# Patient Record
Sex: Female | Born: 1965 | Race: White | Hispanic: No | Marital: Married | Smoking: Current every day smoker
Health system: Southern US, Community
[De-identification: ages and names within clinical notes are randomized; demographics above are authoritative.]

## PROBLEM LIST (undated history)

## (undated) DIAGNOSIS — F329 Major depressive disorder, single episode, unspecified: Secondary | ICD-10-CM

## (undated) DIAGNOSIS — J45909 Unspecified asthma, uncomplicated: Secondary | ICD-10-CM

## (undated) DIAGNOSIS — R058 Other specified cough: Secondary | ICD-10-CM

## (undated) DIAGNOSIS — R011 Cardiac murmur, unspecified: Secondary | ICD-10-CM

## (undated) DIAGNOSIS — J189 Pneumonia, unspecified organism: Secondary | ICD-10-CM

## (undated) DIAGNOSIS — F419 Anxiety disorder, unspecified: Secondary | ICD-10-CM

## (undated) DIAGNOSIS — K219 Gastro-esophageal reflux disease without esophagitis: Secondary | ICD-10-CM

## (undated) DIAGNOSIS — D649 Anemia, unspecified: Secondary | ICD-10-CM

## (undated) DIAGNOSIS — J449 Chronic obstructive pulmonary disease, unspecified: Secondary | ICD-10-CM

## (undated) DIAGNOSIS — R05 Cough: Secondary | ICD-10-CM

## (undated) DIAGNOSIS — E119 Type 2 diabetes mellitus without complications: Secondary | ICD-10-CM

## (undated) DIAGNOSIS — I509 Heart failure, unspecified: Secondary | ICD-10-CM

## (undated) DIAGNOSIS — M1990 Unspecified osteoarthritis, unspecified site: Secondary | ICD-10-CM

## (undated) DIAGNOSIS — F32A Depression, unspecified: Secondary | ICD-10-CM

## (undated) DIAGNOSIS — R0981 Nasal congestion: Secondary | ICD-10-CM

## (undated) HISTORY — PX: APPENDECTOMY: SHX54

## (undated) HISTORY — PX: BREAST SURGERY: SHX581

## (undated) HISTORY — PX: CATARACT EXTRACTION W/ INTRAOCULAR LENS  IMPLANT, BILATERAL: SHX1307

---

## 1996-06-07 HISTORY — PX: ABDOMINAL HYSTERECTOMY: SHX81

## 2000-01-22 ENCOUNTER — Inpatient Hospital Stay (HOSPITAL_COMMUNITY): Admission: AD | Admit: 2000-01-22 | Discharge: 2000-01-22 | Payer: Self-pay | Admitting: *Deleted

## 2001-08-01 ENCOUNTER — Ambulatory Visit (HOSPITAL_COMMUNITY): Admission: RE | Admit: 2001-08-01 | Discharge: 2001-08-01 | Payer: Self-pay | Admitting: *Deleted

## 2001-08-01 ENCOUNTER — Encounter: Payer: Self-pay | Admitting: *Deleted

## 2002-10-22 ENCOUNTER — Encounter: Admission: RE | Admit: 2002-10-22 | Discharge: 2002-10-22 | Payer: Self-pay | Admitting: General Surgery

## 2002-10-22 ENCOUNTER — Encounter: Payer: Self-pay | Admitting: General Surgery

## 2003-09-05 ENCOUNTER — Encounter: Admission: RE | Admit: 2003-09-05 | Discharge: 2003-09-05 | Payer: Self-pay | Admitting: *Deleted

## 2010-06-28 ENCOUNTER — Encounter: Payer: Self-pay | Admitting: Rheumatology

## 2012-03-06 DIAGNOSIS — N6019 Diffuse cystic mastopathy of unspecified breast: Secondary | ICD-10-CM

## 2012-03-06 HISTORY — DX: Diffuse cystic mastopathy of unspecified breast: N60.19

## 2012-03-21 DIAGNOSIS — N61 Mastitis without abscess: Secondary | ICD-10-CM

## 2012-03-21 HISTORY — DX: Mastitis without abscess: N61.0

## 2012-03-31 DIAGNOSIS — J45909 Unspecified asthma, uncomplicated: Secondary | ICD-10-CM | POA: Insufficient documentation

## 2012-03-31 DIAGNOSIS — M797 Fibromyalgia: Secondary | ICD-10-CM

## 2012-03-31 DIAGNOSIS — Z8614 Personal history of Methicillin resistant Staphylococcus aureus infection: Secondary | ICD-10-CM

## 2012-03-31 DIAGNOSIS — I10 Essential (primary) hypertension: Secondary | ICD-10-CM | POA: Insufficient documentation

## 2012-03-31 HISTORY — DX: Fibromyalgia: M79.7

## 2012-03-31 HISTORY — DX: Essential (primary) hypertension: I10

## 2012-03-31 HISTORY — DX: Personal history of Methicillin resistant Staphylococcus aureus infection: Z86.14

## 2013-06-07 DIAGNOSIS — J189 Pneumonia, unspecified organism: Secondary | ICD-10-CM

## 2013-06-07 HISTORY — DX: Pneumonia, unspecified organism: J18.9

## 2013-06-25 DIAGNOSIS — G629 Polyneuropathy, unspecified: Secondary | ICD-10-CM

## 2013-06-25 DIAGNOSIS — G4700 Insomnia, unspecified: Secondary | ICD-10-CM | POA: Insufficient documentation

## 2013-06-25 DIAGNOSIS — R410 Disorientation, unspecified: Secondary | ICD-10-CM

## 2013-06-25 DIAGNOSIS — Z720 Tobacco use: Secondary | ICD-10-CM

## 2013-06-25 DIAGNOSIS — R531 Weakness: Secondary | ICD-10-CM

## 2013-06-25 DIAGNOSIS — E785 Hyperlipidemia, unspecified: Secondary | ICD-10-CM | POA: Insufficient documentation

## 2013-06-25 DIAGNOSIS — F4310 Post-traumatic stress disorder, unspecified: Secondary | ICD-10-CM

## 2013-06-25 HISTORY — DX: Tobacco use: Z72.0

## 2013-06-25 HISTORY — DX: Hyperlipidemia, unspecified: E78.5

## 2013-06-25 HISTORY — DX: Weakness: R53.1

## 2013-06-25 HISTORY — DX: Disorientation, unspecified: R41.0

## 2013-06-25 HISTORY — DX: Post-traumatic stress disorder, unspecified: F43.10

## 2013-06-25 HISTORY — DX: Insomnia, unspecified: G47.00

## 2013-06-25 HISTORY — DX: Polyneuropathy, unspecified: G62.9

## 2013-06-26 DIAGNOSIS — J9691 Respiratory failure, unspecified with hypoxia: Secondary | ICD-10-CM | POA: Insufficient documentation

## 2013-06-26 DIAGNOSIS — J441 Chronic obstructive pulmonary disease with (acute) exacerbation: Secondary | ICD-10-CM | POA: Insufficient documentation

## 2013-06-26 HISTORY — DX: Respiratory failure, unspecified with hypoxia: J96.91

## 2013-06-26 HISTORY — DX: Chronic obstructive pulmonary disease with (acute) exacerbation: J44.1

## 2013-06-30 DIAGNOSIS — A492 Hemophilus influenzae infection, unspecified site: Secondary | ICD-10-CM

## 2013-06-30 DIAGNOSIS — R7881 Bacteremia: Secondary | ICD-10-CM

## 2013-06-30 DIAGNOSIS — B963 Hemophilus influenzae [H. influenzae] as the cause of diseases classified elsewhere: Secondary | ICD-10-CM

## 2013-06-30 HISTORY — DX: Hemophilus influenzae (H. influenzae) as the cause of diseases classified elsewhere: B96.3

## 2013-06-30 HISTORY — DX: Bacteremia: R78.81

## 2013-06-30 HISTORY — DX: Hemophilus influenzae infection, unspecified site: A49.2

## 2013-07-01 DIAGNOSIS — M25519 Pain in unspecified shoulder: Secondary | ICD-10-CM

## 2013-07-01 HISTORY — DX: Pain in unspecified shoulder: M25.519

## 2013-09-13 DIAGNOSIS — F4310 Post-traumatic stress disorder, unspecified: Secondary | ICD-10-CM | POA: Diagnosis not present

## 2013-10-21 DIAGNOSIS — Z880 Allergy status to penicillin: Secondary | ICD-10-CM | POA: Diagnosis not present

## 2013-10-21 DIAGNOSIS — I10 Essential (primary) hypertension: Secondary | ICD-10-CM | POA: Diagnosis not present

## 2013-10-21 DIAGNOSIS — S93409A Sprain of unspecified ligament of unspecified ankle, initial encounter: Secondary | ICD-10-CM | POA: Diagnosis not present

## 2013-10-21 DIAGNOSIS — F17200 Nicotine dependence, unspecified, uncomplicated: Secondary | ICD-10-CM | POA: Diagnosis not present

## 2013-10-21 DIAGNOSIS — J449 Chronic obstructive pulmonary disease, unspecified: Secondary | ICD-10-CM | POA: Diagnosis not present

## 2013-10-21 DIAGNOSIS — Z882 Allergy status to sulfonamides status: Secondary | ICD-10-CM | POA: Diagnosis not present

## 2013-10-21 DIAGNOSIS — X58XXXA Exposure to other specified factors, initial encounter: Secondary | ICD-10-CM | POA: Diagnosis not present

## 2014-02-07 DIAGNOSIS — J189 Pneumonia, unspecified organism: Secondary | ICD-10-CM | POA: Diagnosis not present

## 2014-02-07 DIAGNOSIS — D696 Thrombocytopenia, unspecified: Secondary | ICD-10-CM | POA: Diagnosis not present

## 2014-02-07 DIAGNOSIS — I10 Essential (primary) hypertension: Secondary | ICD-10-CM | POA: Diagnosis not present

## 2014-02-07 DIAGNOSIS — D72819 Decreased white blood cell count, unspecified: Secondary | ICD-10-CM | POA: Diagnosis not present

## 2014-02-07 DIAGNOSIS — E876 Hypokalemia: Secondary | ICD-10-CM | POA: Diagnosis not present

## 2014-02-07 DIAGNOSIS — Z87891 Personal history of nicotine dependence: Secondary | ICD-10-CM | POA: Diagnosis not present

## 2014-02-07 DIAGNOSIS — J449 Chronic obstructive pulmonary disease, unspecified: Secondary | ICD-10-CM | POA: Diagnosis not present

## 2014-02-07 DIAGNOSIS — J0100 Acute maxillary sinusitis, unspecified: Secondary | ICD-10-CM | POA: Diagnosis not present

## 2014-02-20 DIAGNOSIS — R5383 Other fatigue: Secondary | ICD-10-CM | POA: Diagnosis not present

## 2014-02-20 DIAGNOSIS — E860 Dehydration: Secondary | ICD-10-CM | POA: Diagnosis not present

## 2014-02-20 DIAGNOSIS — R5381 Other malaise: Secondary | ICD-10-CM | POA: Diagnosis not present

## 2014-02-20 DIAGNOSIS — R319 Hematuria, unspecified: Secondary | ICD-10-CM | POA: Diagnosis not present

## 2014-02-28 DIAGNOSIS — E876 Hypokalemia: Secondary | ICD-10-CM | POA: Diagnosis not present

## 2014-02-28 DIAGNOSIS — R6889 Other general symptoms and signs: Secondary | ICD-10-CM | POA: Diagnosis not present

## 2014-03-14 DIAGNOSIS — H5213 Myopia, bilateral: Secondary | ICD-10-CM | POA: Diagnosis not present

## 2014-03-14 DIAGNOSIS — H04123 Dry eye syndrome of bilateral lacrimal glands: Secondary | ICD-10-CM | POA: Diagnosis not present

## 2014-03-14 DIAGNOSIS — H25813 Combined forms of age-related cataract, bilateral: Secondary | ICD-10-CM | POA: Diagnosis not present

## 2014-05-08 DIAGNOSIS — J019 Acute sinusitis, unspecified: Secondary | ICD-10-CM | POA: Diagnosis not present

## 2014-05-08 DIAGNOSIS — D696 Thrombocytopenia, unspecified: Secondary | ICD-10-CM | POA: Diagnosis not present

## 2014-05-08 DIAGNOSIS — F329 Major depressive disorder, single episode, unspecified: Secondary | ICD-10-CM | POA: Diagnosis not present

## 2014-05-08 DIAGNOSIS — J449 Chronic obstructive pulmonary disease, unspecified: Secondary | ICD-10-CM | POA: Diagnosis not present

## 2014-05-08 DIAGNOSIS — F419 Anxiety disorder, unspecified: Secondary | ICD-10-CM | POA: Diagnosis not present

## 2014-06-04 DIAGNOSIS — N61 Inflammatory disorders of breast: Secondary | ICD-10-CM | POA: Diagnosis not present

## 2014-06-04 DIAGNOSIS — R509 Fever, unspecified: Secondary | ICD-10-CM | POA: Diagnosis not present

## 2014-06-04 DIAGNOSIS — B963 Hemophilus influenzae [H. influenzae] as the cause of diseases classified elsewhere: Secondary | ICD-10-CM | POA: Diagnosis not present

## 2014-06-04 DIAGNOSIS — I509 Heart failure, unspecified: Secondary | ICD-10-CM | POA: Diagnosis not present

## 2014-06-04 DIAGNOSIS — I10 Essential (primary) hypertension: Secondary | ICD-10-CM | POA: Diagnosis not present

## 2014-06-04 DIAGNOSIS — N644 Mastodynia: Secondary | ICD-10-CM | POA: Diagnosis not present

## 2014-06-04 DIAGNOSIS — N951 Menopausal and female climacteric states: Secondary | ICD-10-CM | POA: Diagnosis not present

## 2014-06-11 DIAGNOSIS — F17290 Nicotine dependence, other tobacco product, uncomplicated: Secondary | ICD-10-CM | POA: Diagnosis not present

## 2014-06-11 DIAGNOSIS — M545 Low back pain: Secondary | ICD-10-CM | POA: Diagnosis not present

## 2014-06-12 DIAGNOSIS — M533 Sacrococcygeal disorders, not elsewhere classified: Secondary | ICD-10-CM | POA: Diagnosis not present

## 2014-06-12 DIAGNOSIS — M545 Low back pain: Secondary | ICD-10-CM | POA: Diagnosis not present

## 2014-06-13 DIAGNOSIS — F4310 Post-traumatic stress disorder, unspecified: Secondary | ICD-10-CM | POA: Diagnosis not present

## 2014-06-26 DIAGNOSIS — T148 Other injury of unspecified body region: Secondary | ICD-10-CM | POA: Diagnosis not present

## 2014-06-26 DIAGNOSIS — E780 Pure hypercholesterolemia: Secondary | ICD-10-CM | POA: Diagnosis not present

## 2014-06-26 DIAGNOSIS — E119 Type 2 diabetes mellitus without complications: Secondary | ICD-10-CM | POA: Diagnosis not present

## 2014-08-05 ENCOUNTER — Encounter (HOSPITAL_COMMUNITY)
Admission: RE | Admit: 2014-08-05 | Discharge: 2014-08-05 | Disposition: A | Discharge status: Home / Self Care | Payer: Medicare Other | Source: Ambulatory Visit | Attending: Anesthesiology | Admitting: Anesthesiology

## 2014-08-05 ENCOUNTER — Encounter (HOSPITAL_COMMUNITY): Payer: Self-pay

## 2014-08-05 ENCOUNTER — Encounter (HOSPITAL_COMMUNITY)
Admission: RE | Admit: 2014-08-05 | Discharge: 2014-08-05 | Disposition: A | Discharge status: Home / Self Care | Payer: Medicare Other | Source: Ambulatory Visit | Attending: Oral Surgery | Admitting: Oral Surgery

## 2014-08-05 DIAGNOSIS — Z01818 Encounter for other preprocedural examination: Secondary | ICD-10-CM | POA: Diagnosis not present

## 2014-08-05 DIAGNOSIS — Z01812 Encounter for preprocedural laboratory examination: Secondary | ICD-10-CM | POA: Insufficient documentation

## 2014-08-05 DIAGNOSIS — K089 Disorder of teeth and supporting structures, unspecified: Secondary | ICD-10-CM | POA: Diagnosis not present

## 2014-08-05 DIAGNOSIS — Z01810 Encounter for preprocedural cardiovascular examination: Secondary | ICD-10-CM | POA: Insufficient documentation

## 2014-08-05 HISTORY — DX: Nasal congestion: R09.81

## 2014-08-05 HISTORY — DX: Cardiac murmur, unspecified: R01.1

## 2014-08-05 HISTORY — DX: Anemia, unspecified: D64.9

## 2014-08-05 HISTORY — DX: Pneumonia, unspecified organism: J18.9

## 2014-08-05 HISTORY — DX: Chronic obstructive pulmonary disease, unspecified: J44.9

## 2014-08-05 HISTORY — DX: Unspecified osteoarthritis, unspecified site: M19.90

## 2014-08-05 HISTORY — DX: Other specified cough: R05.8

## 2014-08-05 HISTORY — DX: Depression, unspecified: F32.A

## 2014-08-05 HISTORY — DX: Gastro-esophageal reflux disease without esophagitis: K21.9

## 2014-08-05 HISTORY — DX: Anxiety disorder, unspecified: F41.9

## 2014-08-05 HISTORY — DX: Major depressive disorder, single episode, unspecified: F32.9

## 2014-08-05 HISTORY — DX: Type 2 diabetes mellitus without complications: E11.9

## 2014-08-05 HISTORY — DX: Unspecified asthma, uncomplicated: J45.909

## 2014-08-05 HISTORY — DX: Heart failure, unspecified: I50.9

## 2014-08-05 HISTORY — DX: Cough: R05

## 2014-08-05 LAB — CBC
HCT: 44.3 % (ref 36.0–46.0)
Hemoglobin: 13.9 g/dL (ref 12.0–15.0)
MCH: 27.9 pg (ref 26.0–34.0)
MCHC: 31.4 g/dL (ref 30.0–36.0)
MCV: 89 fL (ref 78.0–100.0)
PLATELETS: 126 10*3/uL — AB (ref 150–400)
RBC: 4.98 MIL/uL (ref 3.87–5.11)
RDW: 15.9 % — AB (ref 11.5–15.5)
WBC: 3.8 10*3/uL — ABNORMAL LOW (ref 4.0–10.5)

## 2014-08-05 LAB — BASIC METABOLIC PANEL
ANION GAP: 10 (ref 5–15)
BUN: 15 mg/dL (ref 6–23)
CHLORIDE: 110 mmol/L (ref 96–112)
CO2: 22 mmol/L (ref 19–32)
Calcium: 8.9 mg/dL (ref 8.4–10.5)
Creatinine, Ser: 0.72 mg/dL (ref 0.50–1.10)
GFR calc Af Amer: >90 mL/min (ref >90)
Glucose, Bld: 107 mg/dL — ABNORMAL HIGH (ref 70–99)
POTASSIUM: 4.2 mmol/L (ref 3.5–5.1)
Sodium: 142 mmol/L (ref 135–145)

## 2014-08-05 IMAGING — CR DG CHEST 2V
2 series · 2 of 2 positions shown · non-contrast
Comparison: [DATE]

CLINICAL DATA: Preoperative evaluation for upcoming dental surgery

EXAM:
CHEST  2 VIEW

[w chest pa]
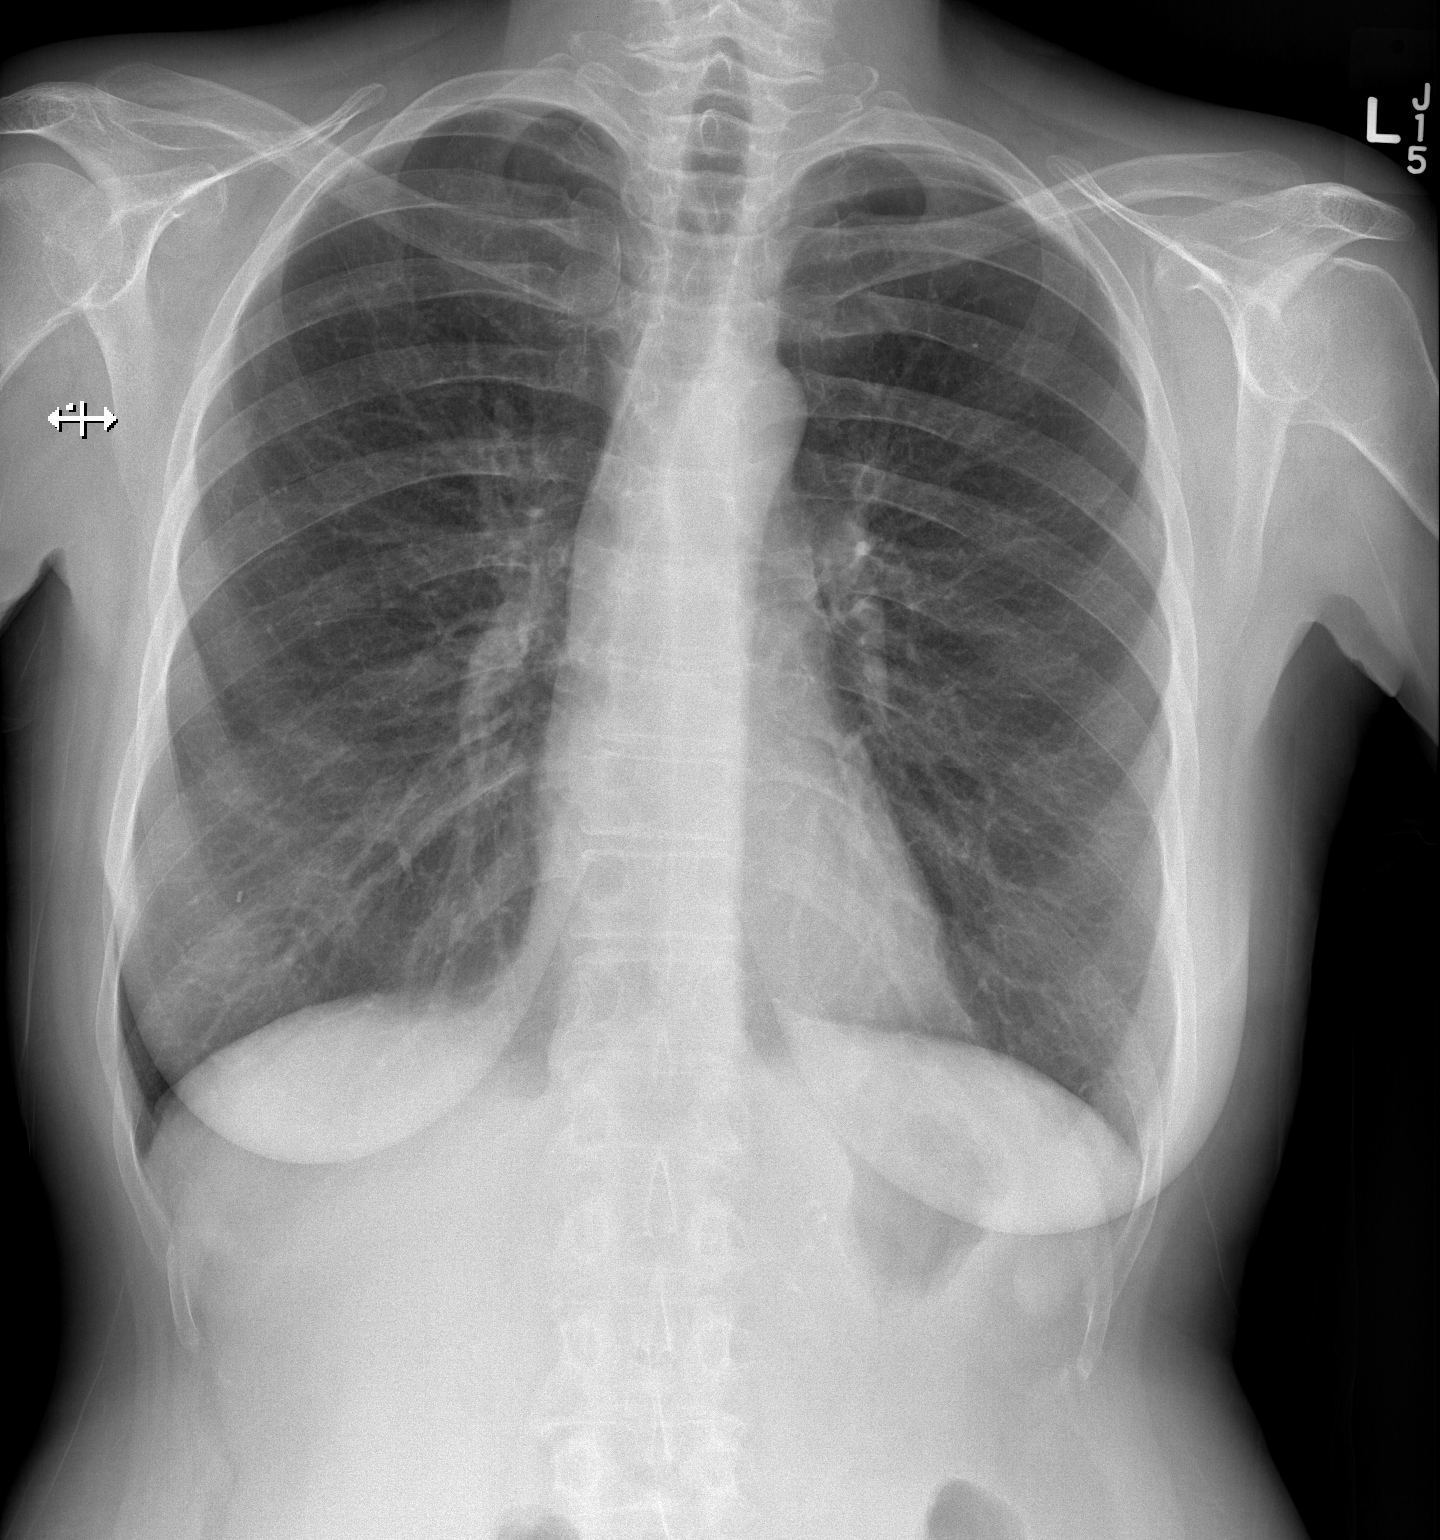

[w chest lat]
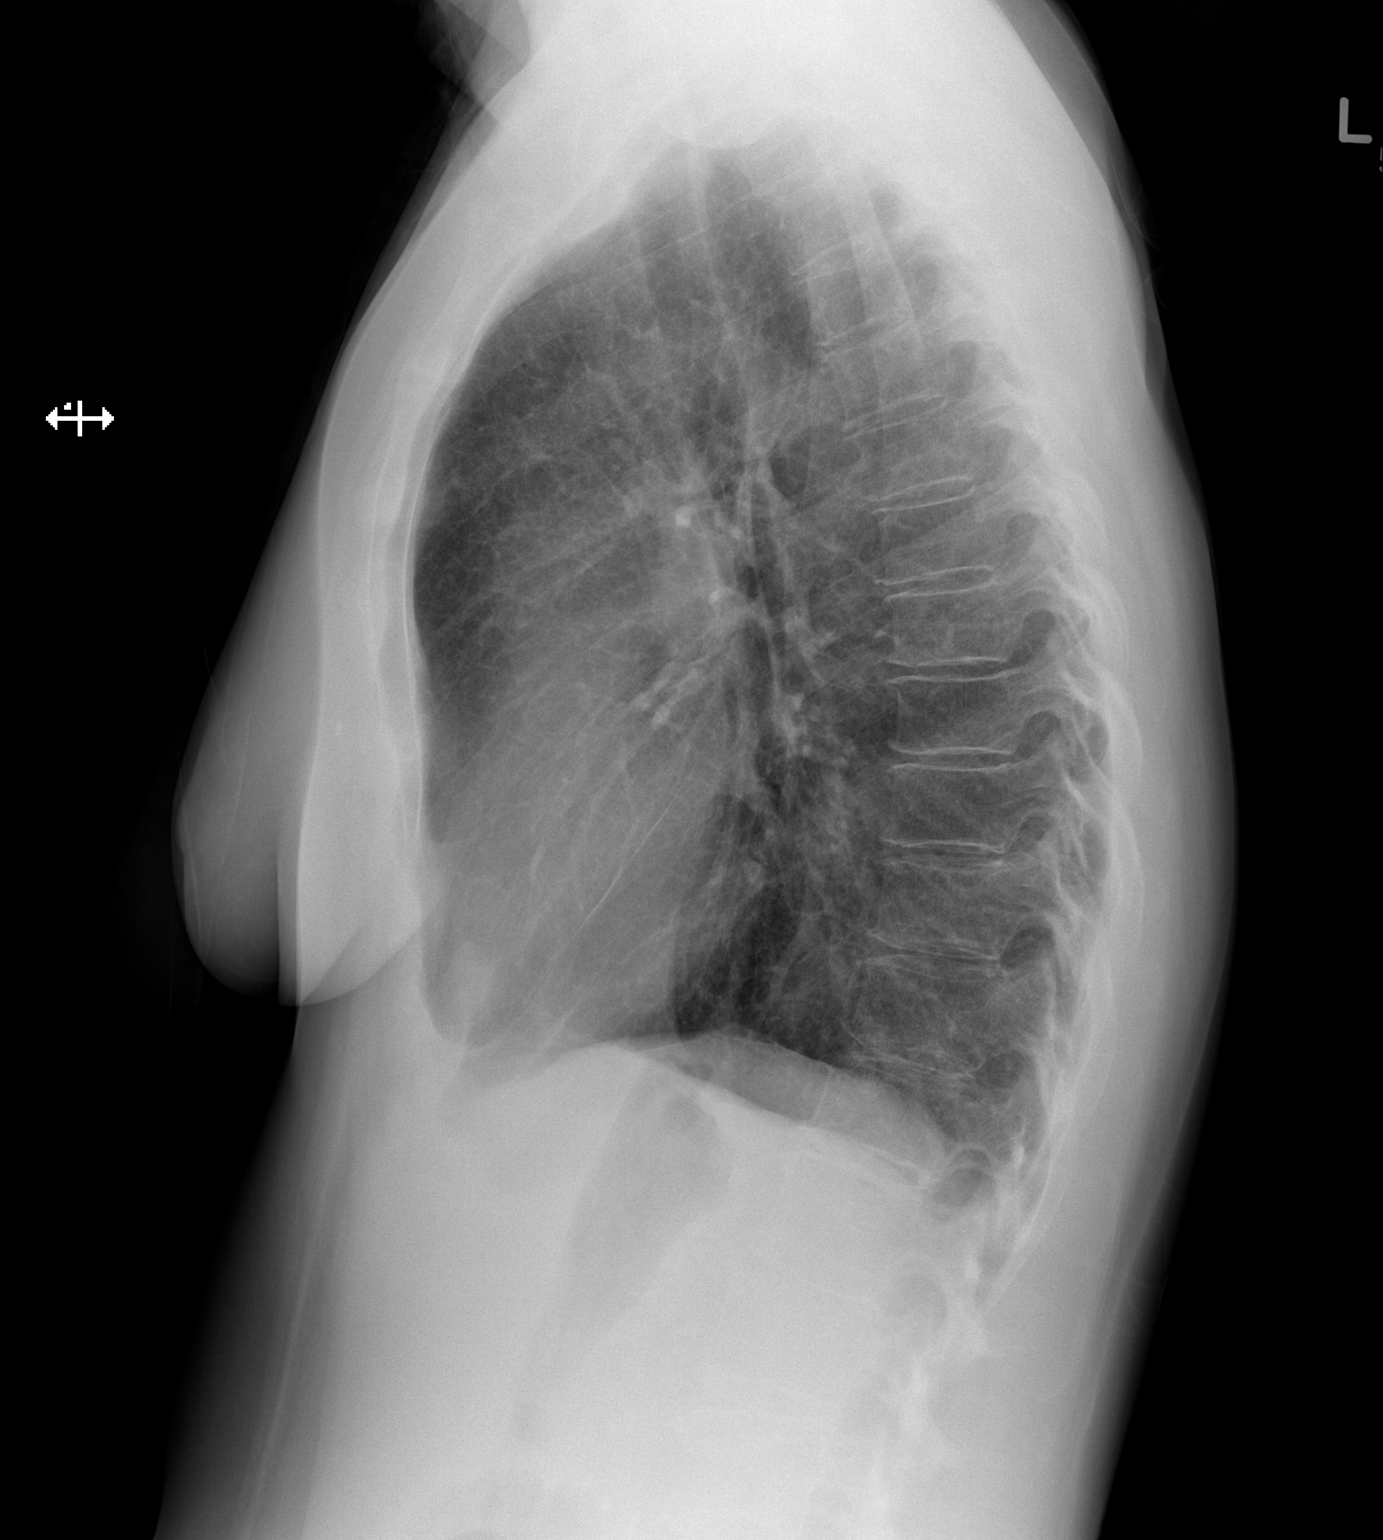

[2 of 2 positions shown; findings below may reference images not displayed]

FINDINGS: The heart size and mediastinal contours are within normal limits.
Both lungs are clear. The visualized skeletal structures are
unremarkable.
IMPRESSION: No active cardiopulmonary disease.

## 2014-08-05 NOTE — H&P (Signed)
HISTORY AND PHYSICAL  [name] is a 49 y.o. female patient with CC: painful teeth  No diagnosis found.  Past Medical History  Diagnosis Date  . Heart murmur   . CHF (congestive heart failure)     hx CHF 1 year ago in [facility]  . Asthma   . COPD (chronic obstructive pulmonary disease)   . Sinus congestion   . Cough with congestion of paranasal sinus   . Pneumonia 2015  . Diabetes mellitus without complication     on meds  . Anxiety   . Depression   . GERD (gastroesophageal reflux disease)   . Arthritis   . Anemia     No current facility-administered medications for this encounter.   Current Outpatient Prescriptions  Medication Sig Dispense Refill  . Aspirin-Caffeine 845-65 MG PACK Take 1 Package by mouth every 4 (four) hours as needed (mouth pain).    . diazepam (VALIUM) 10 MG tablet Take 5 mg by mouth at bedtime as needed for anxiety.    . metFORMIN (GLUCOPHAGE) 500 MG tablet Take 500 mg by mouth 2 (two) times daily with a meal.    . naproxen sodium (ANAPROX) 220 MG tablet Take 220 mg by mouth every 4 (four) hours as needed (pain).    . nystatin (MYCOSTATIN) 100000 UNIT/ML suspension Take 5 mLs by mouth 2 (two) times daily.     Allergies  Allergen Reactions  . Clarithromycin Rash  . Propoxyphene Rash  . Sulfamethoxazole Rash  . Chocolate Diarrhea and Nausea And Vomiting  . Penicillins Hives  . Percocet [Oxycodone-Acetaminophen] Hives  . Sulfa Antibiotics Hives  . Tape Hives    Blisters - paper tape OK  . Codeine Rash   Active Problems:   * No active hospital problems. *  Vitals: There were no vitals taken for this visit. Lab results: Results for orders placed or performed during the hospital encounter of 08/05/14 (from the past 24 hour(s))  Basic metabolic panel     Status: Abnormal   Collection Time: 08/05/14  1:55 PM  Result Value Ref Range   Sodium 142 135 - 145 mmol/L   Potassium 4.2 3.5 - 5.1 mmol/L   Chloride 110 96 - 112 mmol/L   CO2 22 19 - 32 mmol/L   Glucose, Bld 107 (H) 70 - 99 mg/dL   BUN 15 6 - 23 mg/dL   Creatinine, Ser 0.72 0.50 - 1.10 mg/dL   Calcium 8.9 8.4 - 10.5 mg/dL   GFR calc non Af Amer >90 >90 mL/min   GFR calc Af Amer >90 >90 mL/min   Anion gap 10 5 - 15  CBC     Status: Abnormal   Collection Time: 08/05/14  1:55 PM  Result Value Ref Range   WBC 3.8 (L) 4.0 - 10.5 K/uL   RBC 4.98 3.87 - 5.11 MIL/uL   Hemoglobin 13.9 12.0 - 15.0 g/dL   HCT 44.3 36.0 - 46.0 %   MCV 89.0 78.0 - 100.0 fL   MCH 27.9 26.0 - 34.0 pg   MCHC 31.4 30.0 - 36.0 g/dL   RDW 15.9 (H) 11.5 - 15.5 %   Platelets 126 (L) 150 - 400 K/uL   Radiology Results: Dg Chest 2 View  08/05/2014   CLINICAL DATA:  Preoperative evaluation for upcoming dental surgery  EXAM: CHEST  2 VIEW  COMPARISON:  02/07/2014  FINDINGS: The heart size and mediastinal contours are within normal limits. Both lungs are clear. The visualized skeletal structures are  unremarkable.  IMPRESSION: No active cardiopulmonary disease.   Electronically Signed   By: Inez Catalina M.D.   On: 08/05/2014 15:06   General appearance: alert, cooperative and no distress Head: Normocephalic, without obvious abnormality, atraumatic Eyes: negative Nose: Nares normal. Septum midline. Mucosa normal. No drainage or sinus tenderness. Throat: multiple decayed teeth, periodontitis. pharynx clear, no lesions Neck: no adenopathy, supple, symmetrical, trachea midline and thyroid not enlarged, symmetric, no tenderness/mass/nodules Resp: clear to auscultation bilaterally Cardio: regular rate and rhythm, S1, S2 normal, no murmur, click, rub or gallop  Assessment: Multiple non-restorable teeth secondary to dental caries.  Plan: Dental extractions, alveoloplasty. General anesthesia. Day surgery.   Gae Bon 08/05/2014

## 2014-08-05 NOTE — Pre-Procedure Instructions (Signed)
ZISSY HAMLETT  08/05/2014   Your procedure is scheduled on:  Thursday August 09, 2014 at 1015 AM  Report to [facility] Admitting at [phone] AM.  Call this number if you have problems the morning of surgery: [phone]   Remember:   Do not eat food or drink liquids after midnight Wednesday 08/08/14   Take these medicines the morning of surgery with A SIP OF WATER: Valium if needed Stop aspirin and naproxen now.  Do not wear jewelry, make-up or nail polish.  Do not wear lotions, powders, or perfumes. You may wear deodorant.  Do not shave 48 hours prior to surgery.   Do not bring valuables to the hospital.  [facility] is not responsible                  for any belongings or valuables.               Contacts, dentures or bridgework may not be worn into surgery.  Leave suitcase in the car. After surgery it may be brought to your room.  For patients admitted to the hospital, discharge time is determined by your                treatment team.               Patients discharged the day of surgery will not be allowed to drive  home.    Special Instructions: [place] - Preparing for Surgery  Before surgery, you can play an important role.  Because skin is not sterile, your skin needs to be as free of germs as possible.  You can reduce the number of germs on you skin by washing with CHG (chlorahexidine gluconate) soap before surgery.  CHG is an antiseptic cleaner which kills germs and bonds with the skin to continue killing germs even after washing.  Please DO NOT use if you have an allergy to CHG or antibacterial soaps.  If your skin becomes reddened/irritated stop using the CHG and inform your nurse when you arrive at Short Stay.  Do not shave (including legs and underarms) for at least 48 hours prior to the first CHG shower.  You may shave your face.  Please follow these instructions carefully:   1.  Shower with CHG Soap the night before surgery and the                                 morning of Surgery.  2.  If you choose to wash your hair, wash your hair first as usual with your       normal shampoo.  3.  After you shampoo, rinse your hair and body thoroughly to remove the                      Shampoo.  4.  Use CHG as you would any other liquid soap.  You can apply chg directly       to the skin and wash gently with scrungie or a clean washcloth.  5.  Apply the CHG Soap to your body ONLY FROM THE NECK DOWN.        Do not use on open wounds or open sores.  Avoid contact with your eyes,       ears, mouth and genitals (private parts).  Wash genitals (private parts)       with your  normal soap.  6.  Wash thoroughly, paying special attention to the area where your surgery        will be performed.  7.  Thoroughly rinse your body with warm water from the neck down.  8.  DO NOT shower/wash with your normal soap after using and rinsing off       the CHG Soap.  9.  Pat yourself dry with a clean towel.            10.  Wear clean pajamas.            11.  Place clean sheets on your bed the night of your first shower and do not        sleep with pets.  Day of Surgery  Do not apply any lotions/deoderants the morning of surgery.  Please wear clean clothes to the hospital/surgery center.      Please read over the following fact sheets that you were given: Pain Booklet, Coughing and Deep Breathing and Surgical Site Infection Prevention

## 2014-08-06 NOTE — Progress Notes (Signed)
Anesthesia Chart Review:  Pt is 49 year old female scheduled for multiple extractions, alveoloplasty, maxillary exostosis on 08/09/2014 with Dr. Hoyt Koch.   PMH includes: CHF, DM, heart murmur, COPD, anemia, PTSD. Current smoker. BMI 20.   Preoperative labs reviewed.    EKG: NSR.  Biatrial enlargement. Cannot rule out Anterior infarct, age undetermined. Appears unchanged from previous tracing dated 09/02/2013.   Echo 09/03/2013: -technically limited study -grossly normal LV size and function -EF is visually estimated at 60-65% -normal pulmonary artery pressure -trace tricuspid regurgitation  Carotid artery duplex scan 09/02/2013: 1-39% stenosis B ICA. No significant plaque noted.   If no changes, I anticipate pt can proceed with surgery as scheduled.   Willeen Cass, FNP-BC [facility]/Anesthesiology Phone: [phone] 08/06/2014 3:55 PM

## 2014-08-08 MED ORDER — CLINDAMYCIN PHOSPHATE 900 MG/50ML IV SOLN
900.0000 mg | INTRAVENOUS | Status: AC
  Administered 2014-08-09: 900 mg via INTRAVENOUS
  Filled 2014-08-08: qty 50

## 2014-08-08 NOTE — Anesthesia Preprocedure Evaluation (Addendum)
Anesthesia Evaluation  Patient identified by MRN, date of birth, ID band Patient awake    Reviewed: Allergy & Precautions, NPO status , Patient's Chart, lab work & pertinent test results  Airway Mallampati: II   Neck ROM: Full    Dental  (+) Poor Dentition, Dental Advisory Given   Pulmonary COPDCurrent Smoker,  + rhonchi         Cardiovascular  ECHO 2015 EF 65%   Neuro/Psych Anxiety Depression    GI/Hepatic GERD-  Medicated,  Endo/Other  diabetes, Type 2  Renal/GU      Musculoskeletal   Abdominal (+)  Abdomen: soft.    Peds  Hematology 13/44   Anesthesia Other Findings   Reproductive/Obstetrics                           Anesthesia Physical Anesthesia Plan  ASA: II  Anesthesia Plan: General   Post-op Pain Management:    Induction: Intravenous  Airway Management Planned: Nasal ETT  Additional Equipment:   Intra-op Plan:   Post-operative Plan: Extubation in OR  Informed Consent: I have reviewed the patients History and Physical, chart, labs and discussed the procedure including the risks, benefits and alternatives for the proposed anesthesia with the patient or authorized representative who has indicated his/her understanding and acceptance.     Plan Discussed with:   Anesthesia Plan Comments: (Multimodal pain RX)        Anesthesia Quick Evaluation

## 2014-08-09 ENCOUNTER — Ambulatory Visit (HOSPITAL_COMMUNITY): Payer: Medicare Other | Admitting: Anesthesiology

## 2014-08-09 ENCOUNTER — Encounter (HOSPITAL_COMMUNITY)
Admission: RE | Disposition: A | Discharge status: Home / Self Care | Payer: Self-pay | Source: Ambulatory Visit | Attending: Oral Surgery

## 2014-08-09 ENCOUNTER — Ambulatory Visit (HOSPITAL_COMMUNITY): Payer: Medicare Other | Admitting: Emergency Medicine

## 2014-08-09 ENCOUNTER — Ambulatory Visit (HOSPITAL_COMMUNITY)
Admission: RE | Admit: 2014-08-09 | Discharge: 2014-08-09 | Disposition: A | Discharge status: Home / Self Care | Payer: Medicare Other | Source: Ambulatory Visit | Attending: Oral Surgery | Admitting: Oral Surgery

## 2014-08-09 DIAGNOSIS — F17200 Nicotine dependence, unspecified, uncomplicated: Secondary | ICD-10-CM | POA: Diagnosis not present

## 2014-08-09 DIAGNOSIS — Z791 Long term (current) use of non-steroidal anti-inflammatories (NSAID): Secondary | ICD-10-CM | POA: Insufficient documentation

## 2014-08-09 DIAGNOSIS — J449 Chronic obstructive pulmonary disease, unspecified: Secondary | ICD-10-CM | POA: Insufficient documentation

## 2014-08-09 DIAGNOSIS — I509 Heart failure, unspecified: Secondary | ICD-10-CM | POA: Diagnosis not present

## 2014-08-09 DIAGNOSIS — Z79899 Other long term (current) drug therapy: Secondary | ICD-10-CM | POA: Insufficient documentation

## 2014-08-09 DIAGNOSIS — F329 Major depressive disorder, single episode, unspecified: Secondary | ICD-10-CM | POA: Diagnosis not present

## 2014-08-09 DIAGNOSIS — Z862 Personal history of diseases of the blood and blood-forming organs and certain disorders involving the immune mechanism: Secondary | ICD-10-CM | POA: Insufficient documentation

## 2014-08-09 DIAGNOSIS — R011 Cardiac murmur, unspecified: Secondary | ICD-10-CM | POA: Diagnosis not present

## 2014-08-09 DIAGNOSIS — E119 Type 2 diabetes mellitus without complications: Secondary | ICD-10-CM | POA: Insufficient documentation

## 2014-08-09 DIAGNOSIS — F419 Anxiety disorder, unspecified: Secondary | ICD-10-CM | POA: Insufficient documentation

## 2014-08-09 DIAGNOSIS — Z7982 Long term (current) use of aspirin: Secondary | ICD-10-CM | POA: Insufficient documentation

## 2014-08-09 DIAGNOSIS — M278 Other specified diseases of jaws: Secondary | ICD-10-CM | POA: Insufficient documentation

## 2014-08-09 DIAGNOSIS — Z8701 Personal history of pneumonia (recurrent): Secondary | ICD-10-CM | POA: Diagnosis not present

## 2014-08-09 DIAGNOSIS — K219 Gastro-esophageal reflux disease without esophagitis: Secondary | ICD-10-CM | POA: Diagnosis not present

## 2014-08-09 DIAGNOSIS — J45909 Unspecified asthma, uncomplicated: Secondary | ICD-10-CM | POA: Diagnosis not present

## 2014-08-09 DIAGNOSIS — M1990 Unspecified osteoarthritis, unspecified site: Secondary | ICD-10-CM | POA: Diagnosis not present

## 2014-08-09 DIAGNOSIS — K029 Dental caries, unspecified: Secondary | ICD-10-CM | POA: Diagnosis not present

## 2014-08-09 DIAGNOSIS — E139 Other specified diabetes mellitus without complications: Secondary | ICD-10-CM | POA: Diagnosis not present

## 2014-08-09 HISTORY — PX: MULTIPLE EXTRACTIONS WITH ALVEOLOPLASTY: SHX5342

## 2014-08-09 LAB — GLUCOSE, CAPILLARY
GLUCOSE-CAPILLARY: 104 mg/dL — AB (ref 70–99)
Glucose-Capillary: 80 mg/dL (ref 70–99)
Glucose-Capillary: 71 mg/dL (ref 70–99)

## 2014-08-09 SURGERY — MULTIPLE EXTRACTION WITH ALVEOLOPLASTY
Anesthesia: General | Site: Mouth

## 2014-08-09 MED ORDER — DEXAMETHASONE SODIUM PHOSPHATE 4 MG/ML IJ SOLN
INTRAMUSCULAR | Status: AC
  Filled 2014-08-09: qty 2

## 2014-08-09 MED ORDER — ONDANSETRON HCL 4 MG/2ML IJ SOLN
INTRAMUSCULAR | Status: DC | PRN
  Administered 2014-08-09: 4 mg via INTRAVENOUS

## 2014-08-09 MED ORDER — PROPOFOL 10 MG/ML IV BOLUS
INTRAVENOUS | Status: AC
  Filled 2014-08-09: qty 20

## 2014-08-09 MED ORDER — 0.9 % SODIUM CHLORIDE (POUR BTL) OPTIME
TOPICAL | Status: DC | PRN
  Administered 2014-08-09: 1000 mL

## 2014-08-09 MED ORDER — LACTATED RINGERS IV SOLN
INTRAVENOUS | Status: DC | PRN
  Administered 2014-08-09: 10:00:00 via INTRAVENOUS

## 2014-08-09 MED ORDER — SUCCINYLCHOLINE CHLORIDE 20 MG/ML IJ SOLN
INTRAMUSCULAR | Status: DC | PRN
  Administered 2014-08-09: 100 mg via INTRAVENOUS

## 2014-08-09 MED ORDER — FENTANYL CITRATE 0.05 MG/ML IJ SOLN
INTRAMUSCULAR | Status: DC | PRN
  Administered 2014-08-09: 50 ug via INTRAVENOUS

## 2014-08-09 MED ORDER — MIDAZOLAM HCL 2 MG/2ML IJ SOLN
INTRAMUSCULAR | Status: AC
  Filled 2014-08-09: qty 2

## 2014-08-09 MED ORDER — LIDOCAINE-EPINEPHRINE 2 %-1:100000 IJ SOLN
INTRAMUSCULAR | Status: DC | PRN
  Administered 2014-08-09: 17 mL

## 2014-08-09 MED ORDER — PROMETHAZINE HCL 25 MG/ML IJ SOLN
6.2500 mg | INTRAMUSCULAR | Status: DC | PRN
Start: 2014-08-09 — End: 2014-08-09

## 2014-08-09 MED ORDER — DEXAMETHASONE SODIUM PHOSPHATE 4 MG/ML IJ SOLN
INTRAMUSCULAR | Status: DC | PRN
  Administered 2014-08-09: 4 mg via INTRAVENOUS

## 2014-08-09 MED ORDER — FENTANYL CITRATE 0.05 MG/ML IJ SOLN
25.0000 ug | INTRAMUSCULAR | Status: DC | PRN
  Administered 2014-08-09 (×3): 50 ug via INTRAVENOUS

## 2014-08-09 MED ORDER — SUCCINYLCHOLINE CHLORIDE 20 MG/ML IJ SOLN
INTRAMUSCULAR | Status: AC
  Filled 2014-08-09: qty 1

## 2014-08-09 MED ORDER — HYDROCODONE-ACETAMINOPHEN 10-300 MG PO TABS: 1.0000 | ORAL_TABLET | ORAL | Status: DC | PRN

## 2014-08-09 MED ORDER — ONDANSETRON HCL 4 MG/2ML IJ SOLN
INTRAMUSCULAR | Status: AC
  Filled 2014-08-09: qty 4

## 2014-08-09 MED ORDER — GLYCOPYRROLATE 0.2 MG/ML IJ SOLN
INTRAMUSCULAR | Status: DC | PRN
  Administered 2014-08-09: 0.2 mg via INTRAVENOUS

## 2014-08-09 MED ORDER — MIDAZOLAM HCL 5 MG/5ML IJ SOLN
INTRAMUSCULAR | Status: DC | PRN
  Administered 2014-08-09: 2 mg via INTRAVENOUS

## 2014-08-09 MED ORDER — LIDOCAINE HCL (CARDIAC) 20 MG/ML IV SOLN
INTRAVENOUS | Status: AC
  Filled 2014-08-09: qty 5

## 2014-08-09 MED ORDER — FENTANYL CITRATE 0.05 MG/ML IJ SOLN
INTRAMUSCULAR | Status: AC
  Filled 2014-08-09: qty 5

## 2014-08-09 MED ORDER — SODIUM CHLORIDE 0.9 % IR SOLN
Status: DC | PRN
  Administered 2014-08-09: 1000 mL

## 2014-08-09 MED ORDER — PROPOFOL 10 MG/ML IV BOLUS
INTRAVENOUS | Status: DC | PRN
  Administered 2014-08-09: 170 mg via INTRAVENOUS

## 2014-08-09 MED ORDER — FENTANYL CITRATE 0.05 MG/ML IJ SOLN
INTRAMUSCULAR | Status: AC
  Filled 2014-08-09: qty 2

## 2014-08-09 MED ORDER — MEPERIDINE HCL 25 MG/ML IJ SOLN: 6.2500 mg | INTRAMUSCULAR | Status: DC | PRN

## 2014-08-09 MED ORDER — FENTANYL CITRATE 0.05 MG/ML IJ SOLN
INTRAMUSCULAR | Status: AC
  Administered 2014-08-09: 50 ug via INTRAVENOUS
  Filled 2014-08-09: qty 2

## 2014-08-09 MED ORDER — DEXMEDETOMIDINE HCL 200 MCG/2ML IV SOLN
INTRAVENOUS | Status: DC | PRN
  Administered 2014-08-09: 20 ug via INTRAVENOUS

## 2014-08-09 MED ORDER — LACTATED RINGERS IV SOLN
INTRAVENOUS | Status: DC
  Administered 2014-08-09: 50 mL/h via INTRAVENOUS

## 2014-08-09 MED ORDER — LIDOCAINE HCL (CARDIAC) 20 MG/ML IV SOLN
INTRAVENOUS | Status: DC | PRN
  Administered 2014-08-09: 100 mg via INTRAVENOUS

## 2014-08-09 SURGICAL SUPPLY — 33 items
BLADE SURG 15 STRL LF DISP TIS (BLADE) IMPLANT
BLADE SURG 15 STRL SS (BLADE)
BUR CROSS CUT FISSURE 1.6 (BURR) ×2 IMPLANT
BUR CROSS CUT FISSURE 1.6MM (BURR) ×1
BUR EGG ELITE 4.0 (BURR) ×2 IMPLANT
BUR EGG ELITE 4.0MM (BURR) ×1
CANISTER SUCTION 2500CC (MISCELLANEOUS) ×3 IMPLANT
COVER SURGICAL LIGHT HANDLE (MISCELLANEOUS) ×3 IMPLANT
CRADLE DONUT ADULT HEAD (MISCELLANEOUS) ×3 IMPLANT
DECANTER SPIKE VIAL GLASS SM (MISCELLANEOUS) ×3 IMPLANT
GAUZE PACKING FOLDED 2  STR (GAUZE/BANDAGES/DRESSINGS) ×2
GAUZE PACKING FOLDED 2 STR (GAUZE/BANDAGES/DRESSINGS) ×1 IMPLANT
GLOVE BIO SURGEON STRL SZ 6.5 (GLOVE) ×2 IMPLANT
GLOVE BIO SURGEON STRL SZ7.5 (GLOVE) ×3 IMPLANT
GLOVE BIO SURGEONS STRL SZ 6.5 (GLOVE) ×1
GLOVE BIOGEL PI IND STRL 7.0 (GLOVE) ×1 IMPLANT
GLOVE BIOGEL PI INDICATOR 7.0 (GLOVE) ×2
GOWN STRL REUS W/ TWL LRG LVL3 (GOWN DISPOSABLE) ×1 IMPLANT
GOWN STRL REUS W/ TWL XL LVL3 (GOWN DISPOSABLE) ×1 IMPLANT
GOWN STRL REUS W/TWL LRG LVL3 (GOWN DISPOSABLE) ×2
GOWN STRL REUS W/TWL XL LVL3 (GOWN DISPOSABLE) ×2
KIT BASIN OR (CUSTOM PROCEDURE TRAY) ×3 IMPLANT
KIT ROOM TURNOVER OR (KITS) ×3 IMPLANT
NEEDLE 22X1 1/2 (OR ONLY) (NEEDLE) ×6 IMPLANT
NS IRRIG 1000ML POUR BTL (IV SOLUTION) ×3 IMPLANT
PAD ARMBOARD 7.5X6 YLW CONV (MISCELLANEOUS) ×3 IMPLANT
SCRUB BETADINE 4OZ XXX (MISCELLANEOUS) IMPLANT
SUT CHROMIC 3 0 PS 2 (SUTURE) ×6 IMPLANT
SYR CONTROL 10ML LL (SYRINGE) ×3 IMPLANT
TOWEL OR 17X26 10 PK STRL BLUE (TOWEL DISPOSABLE) ×3 IMPLANT
TRAY ENT MC OR (CUSTOM PROCEDURE TRAY) ×3 IMPLANT
TUBING IRRIGATION (MISCELLANEOUS) ×3 IMPLANT
YANKAUER SUCT BULB TIP NO VENT (SUCTIONS) ×3 IMPLANT

## 2014-08-09 NOTE — Transfer of Care (Signed)
Immediate Anesthesia Transfer of Care Note  Patient: [name]  Procedure(s) Performed: Procedure(s): MULTIPLE EXTRACTIONS WITH ALVEOLOPLASTY  (N/A)  Patient Location: PACU  Anesthesia Type:General  Level of Consciousness: awake, alert  and oriented  Airway & Oxygen Therapy: Patient Spontanous Breathing and Patient connected to nasal cannula oxygen  Post-op Assessment: Report given to RN, Post -op Vital signs reviewed and stable and Patient moving all extremities X 4  Post vital signs: Reviewed and stable  Last Vitals:  Filed Vitals:   08/09/14 0749  BP: 138/79  Pulse: 55  Temp: 36.5 C  Resp: 18    Complications: No apparent anesthesia complications

## 2014-08-09 NOTE — Anesthesia Postprocedure Evaluation (Signed)
  Anesthesia Post-op Note  Patient: [name]  Procedure(s) Performed: Procedure(s): MULTIPLE EXTRACTIONS WITH ALVEOLOPLASTY  (N/A)  Patient Location: PACU  Anesthesia Type:General  Level of Consciousness: awake and alert   Airway and Oxygen Therapy: Patient Spontanous Breathing and Patient connected to nasal cannula oxygen  Post-op Pain: mild  Post-op Assessment: Post-op Vital signs reviewed and Patient's Cardiovascular Status Stable  Post-op Vital Signs: Reviewed and stable  Last Vitals:  Filed Vitals:   08/09/14 0749  BP: 138/79  Pulse: 55  Temp: 36.5 C  Resp: 18    Complications: No apparent anesthesia complications

## 2014-08-09 NOTE — H&P (Signed)
H&P documentation  -History and Physical Reviewed  -Patient has been re-examined  -No change in the plan of care  [name] M  

## 2014-08-09 NOTE — Anesthesia Procedure Notes (Signed)
Procedure Name: Intubation Date/Time: 08/09/2014 10:16 AM Performed by: Trixie Deis A Pre-anesthesia Checklist: Patient identified, Timeout performed, Emergency Drugs available, Suction available and Patient being monitored Patient Re-evaluated:Patient Re-evaluated prior to inductionOxygen Delivery Method: Circle system utilized Preoxygenation: Pre-oxygenation with 100% oxygen Intubation Type: IV induction Ventilation: Mask ventilation without difficulty Laryngoscope Size: Mac and 3 Grade View: Grade I Nasal Tubes: Right and Nasal Rae Tube size: 7.0 mm Number of attempts: 1 Placement Confirmation: ETT inserted through vocal cords under direct vision,  breath sounds checked- equal and bilateral and positive ETCO2 Secured at: 27 cm Tube secured with: Tape Dental Injury: Teeth and Oropharynx as per pre-operative assessment

## 2014-08-09 NOTE — Op Note (Signed)
08/09/2014  10:38 AM  PATIENT:  Ewell Poe  49 y.o. female  PRE-OPERATIVE DIAGNOSIS:  NON-RESTORABLE TEETH #'s 5, 6, 7, 11, 12, 13, 14, 18, 29, 30, left maxillary buccal exostosis  POST-OPERATIVE DIAGNOSIS:  SAME  PROCEDURE:  Procedure(s): MULTIPLE EXTRACTIONS Teeth #'s 5, 6, 7, 11, 12, 13, 14, 18, 29, 30,WITH ALVEOLOPLASTY Right and left maxilla, Reduction left MAXILLARY EXOSTOSIS  SURGEON:  Surgeon(s): Gae Bon, DDS  ANESTHESIA:   local and general  EBL:  minimal  DRAINS: none   SPECIMEN:  No Specimen  COUNTS:  YES  PLAN OF CARE: Discharge to home after PACU  PATIENT DISPOSITION:  PACU - hemodynamically stable.   PROCEDURE DETAILS: Dictation # [id number]  Gae Bon, DMD 08/09/2014 10:38 AM

## 2014-08-12 ENCOUNTER — Encounter (HOSPITAL_COMMUNITY): Payer: Self-pay | Admitting: Oral Surgery

## 2014-08-12 NOTE — Op Note (Signed)
[name], [name]            ACCOUNT NO.:  [id number]  MEDICAL RECORD NO.:  [id number]  LOCATION:                                 FACILITY:  PHYSICIAN:  Gae Bon, M.D.  DATE OF BIRTH:  [birth year]  DATE OF PROCEDURE:  08/09/2014 DATE OF DISCHARGE:  08/09/2014                              OPERATIVE REPORT   PREOPERATIVE DIAGNOSES:  Nonrestorable teeth #5, #6, #7, #11, #12, #13, #14, #18, #29, #30, left maxillary buccal exostosis.  POSTOPERATIVE DIAGNOSES:  Nonrestorable teeth #5, #6, #7, #11, #12, #13, #14, #18, #29, #30, left maxillary buccal exostosis.  PROCEDURE:  Extraction of teeth #5, #6, #7, #11, #12, #13, #14,  #18, #29, #30, alveoplasty of the right and left maxilla reduction, left maxillary exostosis.  SURGEON:  Gae Bon, M.D.  ANESTHESIA:  General nasal intubation.  PROCEDURE:  The patient was taken to the operating room, placed on the table in the supine position.  General anesthesia was administered intravenously and a nasal endotracheal tube was placed and secured.  The eyes were protected.  The patient was draped for the procedure.  Time- out was performed.  The posterior pharynx was suctioned and a throat pack was placed.  A 2% lidocaine with 1:100,000 epinephrine was infiltrated in an inferior alveolar block on the right and left side and buccal and palatal infiltration of the maxilla.  A total of 12 mL was utilized.  A bite block was placed in the right side of the mouth and a sweetheart retractor was used to retract the tongue.  A #15 blade was used to make an incision around tooth #18 in the mandible around teeth #11, #12, #13, and #14 in the maxilla.  The  periosteum was reflected with a periosteal  elevator.  The tooth #18 was elevated with a 301 elevator and removed from the mouth with a dental forceps.  The socket was curetted but no suture was necessary in the maxilla.  The teeth were elevated with a 301 elevator.  Teeth numbers #11,  #12, and #13 were removed with the upper 150 forceps.  Tooth #14 required bone removal with the Stryker handpiece under irrigation and sectioning with a Stryker handpiece.  The roots were removed individually with a 301 elevator and hemostats.  Then the periosteum was reflected in the left maxilla and the bony exostosis in the buccal aspect was reduced using a Stryker handpiece with the egg-shaped bur and the bone file.  Then, alveoplasty was performed using the egg-shaped bur and bone file in this quadrant as well.  Then, the area was irrigated and closed with 3-0 chromic.  The sweetheart retractor and bite block were repositioned in the other side of the mouth and a 15 blade used to make an incision around teeth #29 and #30 in the mandible and around teeth #5, #6, and #7 in the maxilla.  The periosteum was reflected from around these teeth with a periosteal elevator.  The teeth were elevated with a 301 elevator and removed from the mouth with a dental forceps.  The sockets were then curetted, irrigated,  and closed with 3-0 chromic.  The oral cavity was inspected and found to have  good contour, hemostasis, and closure.  The oral cavity was irrigated and suctioned.  The throat pack was removed. The patient was awakened and taken to the recovery room, breathing spontaneously, and in good condition.  ESTIMATED BLOOD LOSS:  Minimal.  COMPLICATIONS:  None.  SPECIMENS:  None.     Gae Bon, M.D.     SMJ/MEDQ  D:  08/09/2014  T:  08/09/2014  Job:  [id number]

## 2014-09-24 DIAGNOSIS — F3341 Major depressive disorder, recurrent, in partial remission: Secondary | ICD-10-CM | POA: Diagnosis not present

## 2014-12-03 DIAGNOSIS — F3341 Major depressive disorder, recurrent, in partial remission: Secondary | ICD-10-CM | POA: Diagnosis not present

## 2014-12-04 DIAGNOSIS — J449 Chronic obstructive pulmonary disease, unspecified: Secondary | ICD-10-CM | POA: Diagnosis not present

## 2014-12-04 DIAGNOSIS — F17200 Nicotine dependence, unspecified, uncomplicated: Secondary | ICD-10-CM | POA: Diagnosis not present

## 2014-12-04 DIAGNOSIS — R4182 Altered mental status, unspecified: Secondary | ICD-10-CM | POA: Diagnosis not present

## 2014-12-04 DIAGNOSIS — S0990XA Unspecified injury of head, initial encounter: Secondary | ICD-10-CM | POA: Diagnosis not present

## 2014-12-04 DIAGNOSIS — J45909 Unspecified asthma, uncomplicated: Secondary | ICD-10-CM | POA: Diagnosis not present

## 2014-12-04 DIAGNOSIS — N390 Urinary tract infection, site not specified: Secondary | ICD-10-CM | POA: Diagnosis not present

## 2014-12-04 DIAGNOSIS — R410 Disorientation, unspecified: Secondary | ICD-10-CM | POA: Diagnosis not present

## 2014-12-04 DIAGNOSIS — R319 Hematuria, unspecified: Secondary | ICD-10-CM | POA: Diagnosis not present

## 2015-01-16 DIAGNOSIS — E119 Type 2 diabetes mellitus without complications: Secondary | ICD-10-CM | POA: Diagnosis not present

## 2015-01-16 DIAGNOSIS — F17200 Nicotine dependence, unspecified, uncomplicated: Secondary | ICD-10-CM | POA: Diagnosis not present

## 2015-01-16 DIAGNOSIS — Z139 Encounter for screening, unspecified: Secondary | ICD-10-CM | POA: Diagnosis not present

## 2015-01-16 DIAGNOSIS — E780 Pure hypercholesterolemia: Secondary | ICD-10-CM | POA: Diagnosis not present

## 2015-01-16 DIAGNOSIS — Z716 Tobacco abuse counseling: Secondary | ICD-10-CM | POA: Diagnosis not present

## 2015-01-16 DIAGNOSIS — J449 Chronic obstructive pulmonary disease, unspecified: Secondary | ICD-10-CM | POA: Diagnosis not present

## 2015-01-16 DIAGNOSIS — N390 Urinary tract infection, site not specified: Secondary | ICD-10-CM | POA: Diagnosis not present

## 2015-01-21 DIAGNOSIS — M25579 Pain in unspecified ankle and joints of unspecified foot: Secondary | ICD-10-CM | POA: Diagnosis not present

## 2015-01-21 DIAGNOSIS — M722 Plantar fascial fibromatosis: Secondary | ICD-10-CM | POA: Diagnosis not present

## 2015-01-30 DIAGNOSIS — F3341 Major depressive disorder, recurrent, in partial remission: Secondary | ICD-10-CM | POA: Diagnosis not present

## 2015-02-07 DIAGNOSIS — A048 Other specified bacterial intestinal infections: Secondary | ICD-10-CM | POA: Diagnosis not present

## 2015-02-07 DIAGNOSIS — J020 Streptococcal pharyngitis: Secondary | ICD-10-CM | POA: Diagnosis not present

## 2015-02-07 DIAGNOSIS — F17200 Nicotine dependence, unspecified, uncomplicated: Secondary | ICD-10-CM | POA: Diagnosis not present

## 2015-02-07 DIAGNOSIS — Z716 Tobacco abuse counseling: Secondary | ICD-10-CM | POA: Diagnosis not present

## 2015-02-07 DIAGNOSIS — R109 Unspecified abdominal pain: Secondary | ICD-10-CM | POA: Diagnosis not present

## 2015-02-07 DIAGNOSIS — J449 Chronic obstructive pulmonary disease, unspecified: Secondary | ICD-10-CM | POA: Diagnosis not present

## 2015-03-27 DIAGNOSIS — F3341 Major depressive disorder, recurrent, in partial remission: Secondary | ICD-10-CM | POA: Diagnosis not present

## 2015-03-28 DIAGNOSIS — Z79899 Other long term (current) drug therapy: Secondary | ICD-10-CM | POA: Diagnosis not present

## 2015-07-04 DIAGNOSIS — J449 Chronic obstructive pulmonary disease, unspecified: Secondary | ICD-10-CM | POA: Diagnosis not present

## 2015-07-04 DIAGNOSIS — E785 Hyperlipidemia, unspecified: Secondary | ICD-10-CM | POA: Diagnosis not present

## 2015-07-04 DIAGNOSIS — Z681 Body mass index (BMI) 19 or less, adult: Secondary | ICD-10-CM | POA: Diagnosis not present

## 2015-07-04 DIAGNOSIS — E119 Type 2 diabetes mellitus without complications: Secondary | ICD-10-CM | POA: Diagnosis not present

## 2015-07-04 DIAGNOSIS — R636 Underweight: Secondary | ICD-10-CM | POA: Diagnosis not present

## 2015-07-04 DIAGNOSIS — R0602 Shortness of breath: Secondary | ICD-10-CM | POA: Diagnosis not present

## 2015-07-04 DIAGNOSIS — I10 Essential (primary) hypertension: Secondary | ICD-10-CM | POA: Diagnosis not present

## 2015-07-04 DIAGNOSIS — I509 Heart failure, unspecified: Secondary | ICD-10-CM | POA: Diagnosis not present

## 2015-07-04 DIAGNOSIS — Z720 Tobacco use: Secondary | ICD-10-CM | POA: Diagnosis not present

## 2015-07-04 DIAGNOSIS — R531 Weakness: Secondary | ICD-10-CM | POA: Diagnosis not present

## 2015-07-17 DIAGNOSIS — F4310 Post-traumatic stress disorder, unspecified: Secondary | ICD-10-CM | POA: Diagnosis not present

## 2015-07-29 DIAGNOSIS — F17210 Nicotine dependence, cigarettes, uncomplicated: Secondary | ICD-10-CM | POA: Diagnosis not present

## 2015-07-29 DIAGNOSIS — R0602 Shortness of breath: Secondary | ICD-10-CM | POA: Diagnosis not present

## 2015-07-29 DIAGNOSIS — M7989 Other specified soft tissue disorders: Secondary | ICD-10-CM | POA: Diagnosis not present

## 2015-07-29 DIAGNOSIS — I10 Essential (primary) hypertension: Secondary | ICD-10-CM | POA: Diagnosis not present

## 2015-08-18 DIAGNOSIS — F4310 Post-traumatic stress disorder, unspecified: Secondary | ICD-10-CM | POA: Diagnosis not present

## 2015-08-21 DIAGNOSIS — S3992XA Unspecified injury of lower back, initial encounter: Secondary | ICD-10-CM | POA: Diagnosis not present

## 2015-08-21 DIAGNOSIS — M545 Low back pain: Secondary | ICD-10-CM | POA: Diagnosis not present

## 2015-08-21 DIAGNOSIS — F17210 Nicotine dependence, cigarettes, uncomplicated: Secondary | ICD-10-CM | POA: Diagnosis not present

## 2015-08-21 DIAGNOSIS — S39012A Strain of muscle, fascia and tendon of lower back, initial encounter: Secondary | ICD-10-CM | POA: Diagnosis not present

## 2015-08-21 DIAGNOSIS — Z882 Allergy status to sulfonamides status: Secondary | ICD-10-CM | POA: Diagnosis not present

## 2015-08-21 DIAGNOSIS — Z885 Allergy status to narcotic agent status: Secondary | ICD-10-CM | POA: Diagnosis not present

## 2015-08-21 DIAGNOSIS — Z880 Allergy status to penicillin: Secondary | ICD-10-CM | POA: Diagnosis not present

## 2015-08-21 DIAGNOSIS — Z888 Allergy status to other drugs, medicaments and biological substances status: Secondary | ICD-10-CM | POA: Diagnosis not present

## 2015-08-25 DIAGNOSIS — I509 Heart failure, unspecified: Secondary | ICD-10-CM | POA: Diagnosis not present

## 2015-08-25 DIAGNOSIS — R636 Underweight: Secondary | ICD-10-CM | POA: Diagnosis not present

## 2015-08-25 DIAGNOSIS — J441 Chronic obstructive pulmonary disease with (acute) exacerbation: Secondary | ICD-10-CM | POA: Diagnosis not present

## 2015-08-25 DIAGNOSIS — F4310 Post-traumatic stress disorder, unspecified: Secondary | ICD-10-CM | POA: Diagnosis not present

## 2015-08-25 DIAGNOSIS — Z1389 Encounter for screening for other disorder: Secondary | ICD-10-CM | POA: Diagnosis not present

## 2015-08-25 DIAGNOSIS — I10 Essential (primary) hypertension: Secondary | ICD-10-CM | POA: Diagnosis not present

## 2015-08-25 DIAGNOSIS — Z681 Body mass index (BMI) 19 or less, adult: Secondary | ICD-10-CM | POA: Diagnosis not present

## 2015-08-25 DIAGNOSIS — Z23 Encounter for immunization: Secondary | ICD-10-CM | POA: Diagnosis not present

## 2015-08-25 DIAGNOSIS — J449 Chronic obstructive pulmonary disease, unspecified: Secondary | ICD-10-CM | POA: Diagnosis not present

## 2015-08-26 DIAGNOSIS — Z113 Encounter for screening for infections with a predominantly sexual mode of transmission: Secondary | ICD-10-CM | POA: Diagnosis not present

## 2015-09-03 DIAGNOSIS — J349 Unspecified disorder of nose and nasal sinuses: Secondary | ICD-10-CM | POA: Diagnosis not present

## 2015-09-03 DIAGNOSIS — F17200 Nicotine dependence, unspecified, uncomplicated: Secondary | ICD-10-CM | POA: Diagnosis not present

## 2015-09-03 DIAGNOSIS — R51 Headache: Secondary | ICD-10-CM | POA: Diagnosis not present

## 2015-10-02 DIAGNOSIS — J449 Chronic obstructive pulmonary disease, unspecified: Secondary | ICD-10-CM | POA: Diagnosis not present

## 2015-10-03 DIAGNOSIS — J019 Acute sinusitis, unspecified: Secondary | ICD-10-CM | POA: Diagnosis not present

## 2015-10-03 DIAGNOSIS — M722 Plantar fascial fibromatosis: Secondary | ICD-10-CM | POA: Diagnosis not present

## 2015-10-03 DIAGNOSIS — R636 Underweight: Secondary | ICD-10-CM | POA: Diagnosis not present

## 2015-10-03 DIAGNOSIS — Z681 Body mass index (BMI) 19 or less, adult: Secondary | ICD-10-CM | POA: Diagnosis not present

## 2015-10-03 DIAGNOSIS — J209 Acute bronchitis, unspecified: Secondary | ICD-10-CM | POA: Diagnosis not present

## 2015-10-23 DIAGNOSIS — F3341 Major depressive disorder, recurrent, in partial remission: Secondary | ICD-10-CM | POA: Diagnosis not present

## 2015-11-25 DIAGNOSIS — H25813 Combined forms of age-related cataract, bilateral: Secondary | ICD-10-CM | POA: Diagnosis not present

## 2015-12-04 DIAGNOSIS — J449 Chronic obstructive pulmonary disease, unspecified: Secondary | ICD-10-CM | POA: Diagnosis not present

## 2015-12-04 DIAGNOSIS — I10 Essential (primary) hypertension: Secondary | ICD-10-CM | POA: Diagnosis not present

## 2015-12-04 DIAGNOSIS — F17210 Nicotine dependence, cigarettes, uncomplicated: Secondary | ICD-10-CM | POA: Diagnosis not present

## 2015-12-04 DIAGNOSIS — J441 Chronic obstructive pulmonary disease with (acute) exacerbation: Secondary | ICD-10-CM | POA: Diagnosis not present

## 2015-12-04 DIAGNOSIS — R0789 Other chest pain: Secondary | ICD-10-CM | POA: Diagnosis not present

## 2015-12-04 DIAGNOSIS — R05 Cough: Secondary | ICD-10-CM | POA: Diagnosis not present

## 2015-12-04 DIAGNOSIS — R079 Chest pain, unspecified: Secondary | ICD-10-CM | POA: Diagnosis not present

## 2015-12-10 DIAGNOSIS — J449 Chronic obstructive pulmonary disease, unspecified: Secondary | ICD-10-CM | POA: Diagnosis not present

## 2015-12-10 DIAGNOSIS — K219 Gastro-esophageal reflux disease without esophagitis: Secondary | ICD-10-CM | POA: Diagnosis not present

## 2015-12-10 DIAGNOSIS — E559 Vitamin D deficiency, unspecified: Secondary | ICD-10-CM | POA: Diagnosis not present

## 2015-12-10 DIAGNOSIS — R636 Underweight: Secondary | ICD-10-CM | POA: Diagnosis not present

## 2015-12-10 DIAGNOSIS — Z681 Body mass index (BMI) 19 or less, adult: Secondary | ICD-10-CM | POA: Diagnosis not present

## 2016-01-19 DIAGNOSIS — H25811 Combined forms of age-related cataract, right eye: Secondary | ICD-10-CM | POA: Diagnosis not present

## 2016-01-19 DIAGNOSIS — H25041 Posterior subcapsular polar age-related cataract, right eye: Secondary | ICD-10-CM | POA: Diagnosis not present

## 2016-01-19 DIAGNOSIS — H268 Other specified cataract: Secondary | ICD-10-CM | POA: Diagnosis not present

## 2016-01-19 DIAGNOSIS — Z01818 Encounter for other preprocedural examination: Secondary | ICD-10-CM | POA: Diagnosis not present

## 2016-01-19 DIAGNOSIS — H25043 Posterior subcapsular polar age-related cataract, bilateral: Secondary | ICD-10-CM | POA: Diagnosis not present

## 2016-02-26 DIAGNOSIS — R300 Dysuria: Secondary | ICD-10-CM | POA: Diagnosis not present

## 2016-02-26 DIAGNOSIS — R400 Somnolence: Secondary | ICD-10-CM | POA: Diagnosis not present

## 2016-02-26 DIAGNOSIS — Z79899 Other long term (current) drug therapy: Secondary | ICD-10-CM | POA: Diagnosis not present

## 2016-02-26 DIAGNOSIS — I10 Essential (primary) hypertension: Secondary | ICD-10-CM | POA: Diagnosis not present

## 2016-02-26 DIAGNOSIS — D649 Anemia, unspecified: Secondary | ICD-10-CM | POA: Diagnosis not present

## 2016-02-26 DIAGNOSIS — M545 Low back pain: Secondary | ICD-10-CM | POA: Diagnosis not present

## 2016-02-26 DIAGNOSIS — Z8249 Family history of ischemic heart disease and other diseases of the circulatory system: Secondary | ICD-10-CM | POA: Diagnosis not present

## 2016-02-26 DIAGNOSIS — R1030 Lower abdominal pain, unspecified: Secondary | ICD-10-CM | POA: Diagnosis not present

## 2016-02-26 DIAGNOSIS — J449 Chronic obstructive pulmonary disease, unspecified: Secondary | ICD-10-CM | POA: Diagnosis not present

## 2016-02-26 DIAGNOSIS — R252 Cramp and spasm: Secondary | ICD-10-CM | POA: Diagnosis not present

## 2016-02-26 DIAGNOSIS — R110 Nausea: Secondary | ICD-10-CM | POA: Diagnosis not present

## 2016-02-26 DIAGNOSIS — E785 Hyperlipidemia, unspecified: Secondary | ICD-10-CM | POA: Diagnosis not present

## 2016-02-26 DIAGNOSIS — R197 Diarrhea, unspecified: Secondary | ICD-10-CM | POA: Diagnosis not present

## 2016-02-26 DIAGNOSIS — F17200 Nicotine dependence, unspecified, uncomplicated: Secondary | ICD-10-CM | POA: Diagnosis not present

## 2016-02-26 DIAGNOSIS — R109 Unspecified abdominal pain: Secondary | ICD-10-CM | POA: Diagnosis not present

## 2016-02-26 DIAGNOSIS — R1900 Intra-abdominal and pelvic swelling, mass and lump, unspecified site: Secondary | ICD-10-CM | POA: Diagnosis not present

## 2016-02-26 DIAGNOSIS — N83202 Unspecified ovarian cyst, left side: Secondary | ICD-10-CM | POA: Diagnosis not present

## 2016-02-27 DIAGNOSIS — N83202 Unspecified ovarian cyst, left side: Secondary | ICD-10-CM | POA: Diagnosis not present

## 2016-03-04 DIAGNOSIS — J449 Chronic obstructive pulmonary disease, unspecified: Secondary | ICD-10-CM | POA: Diagnosis not present

## 2016-03-04 DIAGNOSIS — Z720 Tobacco use: Secondary | ICD-10-CM | POA: Diagnosis not present

## 2016-03-04 DIAGNOSIS — N949 Unspecified condition associated with female genital organs and menstrual cycle: Secondary | ICD-10-CM | POA: Diagnosis not present

## 2016-03-04 DIAGNOSIS — I272 Other secondary pulmonary hypertension: Secondary | ICD-10-CM | POA: Diagnosis not present

## 2016-03-04 DIAGNOSIS — F419 Anxiety disorder, unspecified: Secondary | ICD-10-CM | POA: Diagnosis not present

## 2016-03-08 DIAGNOSIS — E785 Hyperlipidemia, unspecified: Secondary | ICD-10-CM | POA: Diagnosis not present

## 2016-03-08 DIAGNOSIS — F17210 Nicotine dependence, cigarettes, uncomplicated: Secondary | ICD-10-CM | POA: Diagnosis not present

## 2016-03-08 DIAGNOSIS — I10 Essential (primary) hypertension: Secondary | ICD-10-CM | POA: Diagnosis not present

## 2016-03-08 DIAGNOSIS — J441 Chronic obstructive pulmonary disease with (acute) exacerbation: Secondary | ICD-10-CM | POA: Diagnosis not present

## 2016-03-08 DIAGNOSIS — Z01818 Encounter for other preprocedural examination: Secondary | ICD-10-CM | POA: Diagnosis not present

## 2016-03-08 DIAGNOSIS — N949 Unspecified condition associated with female genital organs and menstrual cycle: Secondary | ICD-10-CM | POA: Diagnosis not present

## 2016-03-11 DIAGNOSIS — R102 Pelvic and perineal pain: Secondary | ICD-10-CM | POA: Diagnosis not present

## 2016-03-11 DIAGNOSIS — Z681 Body mass index (BMI) 19 or less, adult: Secondary | ICD-10-CM | POA: Diagnosis not present

## 2016-03-11 DIAGNOSIS — Z23 Encounter for immunization: Secondary | ICD-10-CM | POA: Diagnosis not present

## 2016-03-11 DIAGNOSIS — J309 Allergic rhinitis, unspecified: Secondary | ICD-10-CM | POA: Diagnosis not present

## 2016-03-11 DIAGNOSIS — J449 Chronic obstructive pulmonary disease, unspecified: Secondary | ICD-10-CM | POA: Diagnosis not present

## 2016-03-11 DIAGNOSIS — Z79899 Other long term (current) drug therapy: Secondary | ICD-10-CM | POA: Diagnosis not present

## 2016-03-11 DIAGNOSIS — G4734 Idiopathic sleep related nonobstructive alveolar hypoventilation: Secondary | ICD-10-CM | POA: Diagnosis not present

## 2016-03-11 DIAGNOSIS — R636 Underweight: Secondary | ICD-10-CM | POA: Diagnosis not present

## 2016-03-11 DIAGNOSIS — K219 Gastro-esophageal reflux disease without esophagitis: Secondary | ICD-10-CM | POA: Diagnosis not present

## 2016-03-11 DIAGNOSIS — N949 Unspecified condition associated with female genital organs and menstrual cycle: Secondary | ICD-10-CM | POA: Diagnosis not present

## 2016-03-11 DIAGNOSIS — R0902 Hypoxemia: Secondary | ICD-10-CM | POA: Diagnosis not present

## 2016-03-11 DIAGNOSIS — Z09 Encounter for follow-up examination after completed treatment for conditions other than malignant neoplasm: Secondary | ICD-10-CM | POA: Diagnosis not present

## 2016-03-15 DIAGNOSIS — Z01818 Encounter for other preprocedural examination: Secondary | ICD-10-CM

## 2016-03-15 HISTORY — DX: Encounter for other preprocedural examination: Z01.818

## 2016-03-22 DIAGNOSIS — J449 Chronic obstructive pulmonary disease, unspecified: Secondary | ICD-10-CM | POA: Diagnosis not present

## 2016-03-22 DIAGNOSIS — R1909 Other intra-abdominal and pelvic swelling, mass and lump: Secondary | ICD-10-CM | POA: Diagnosis not present

## 2016-03-22 DIAGNOSIS — Z79899 Other long term (current) drug therapy: Secondary | ICD-10-CM | POA: Diagnosis not present

## 2016-03-22 DIAGNOSIS — F17210 Nicotine dependence, cigarettes, uncomplicated: Secondary | ICD-10-CM | POA: Diagnosis not present

## 2016-03-22 DIAGNOSIS — Z01818 Encounter for other preprocedural examination: Secondary | ICD-10-CM | POA: Diagnosis not present

## 2016-03-22 DIAGNOSIS — E785 Hyperlipidemia, unspecified: Secondary | ICD-10-CM | POA: Diagnosis not present

## 2016-03-22 DIAGNOSIS — I10 Essential (primary) hypertension: Secondary | ICD-10-CM | POA: Diagnosis not present

## 2016-03-23 DIAGNOSIS — Z885 Allergy status to narcotic agent status: Secondary | ICD-10-CM | POA: Diagnosis not present

## 2016-03-23 DIAGNOSIS — F17210 Nicotine dependence, cigarettes, uncomplicated: Secondary | ICD-10-CM | POA: Diagnosis not present

## 2016-03-23 DIAGNOSIS — Z7982 Long term (current) use of aspirin: Secondary | ICD-10-CM | POA: Diagnosis not present

## 2016-03-23 DIAGNOSIS — Z79899 Other long term (current) drug therapy: Secondary | ICD-10-CM | POA: Diagnosis not present

## 2016-03-23 DIAGNOSIS — N838 Other noninflammatory disorders of ovary, fallopian tube and broad ligament: Secondary | ICD-10-CM | POA: Diagnosis not present

## 2016-03-23 DIAGNOSIS — I10 Essential (primary) hypertension: Secondary | ICD-10-CM | POA: Diagnosis not present

## 2016-03-23 DIAGNOSIS — D270 Benign neoplasm of right ovary: Secondary | ICD-10-CM | POA: Diagnosis not present

## 2016-03-23 DIAGNOSIS — Z8759 Personal history of other complications of pregnancy, childbirth and the puerperium: Secondary | ICD-10-CM | POA: Diagnosis not present

## 2016-03-23 DIAGNOSIS — R1900 Intra-abdominal and pelvic swelling, mass and lump, unspecified site: Secondary | ICD-10-CM | POA: Diagnosis not present

## 2016-03-23 DIAGNOSIS — J449 Chronic obstructive pulmonary disease, unspecified: Secondary | ICD-10-CM | POA: Diagnosis not present

## 2016-03-23 DIAGNOSIS — D279 Benign neoplasm of unspecified ovary: Secondary | ICD-10-CM | POA: Diagnosis not present

## 2016-03-23 DIAGNOSIS — E785 Hyperlipidemia, unspecified: Secondary | ICD-10-CM | POA: Diagnosis not present

## 2016-03-23 DIAGNOSIS — D271 Benign neoplasm of left ovary: Secondary | ICD-10-CM | POA: Diagnosis not present

## 2016-04-01 DIAGNOSIS — N949 Unspecified condition associated with female genital organs and menstrual cycle: Secondary | ICD-10-CM | POA: Diagnosis not present

## 2016-04-01 DIAGNOSIS — Z09 Encounter for follow-up examination after completed treatment for conditions other than malignant neoplasm: Secondary | ICD-10-CM | POA: Diagnosis not present

## 2016-04-08 DIAGNOSIS — F3341 Major depressive disorder, recurrent, in partial remission: Secondary | ICD-10-CM | POA: Diagnosis not present

## 2016-04-09 DIAGNOSIS — L819 Disorder of pigmentation, unspecified: Secondary | ICD-10-CM | POA: Diagnosis not present

## 2016-04-09 DIAGNOSIS — Z681 Body mass index (BMI) 19 or less, adult: Secondary | ICD-10-CM | POA: Diagnosis not present

## 2016-04-09 DIAGNOSIS — M79672 Pain in left foot: Secondary | ICD-10-CM | POA: Diagnosis not present

## 2016-04-23 ENCOUNTER — Ambulatory Visit: Payer: Medicare Other | Admitting: Sports Medicine

## 2016-05-10 DIAGNOSIS — R636 Underweight: Secondary | ICD-10-CM | POA: Diagnosis not present

## 2016-05-10 DIAGNOSIS — R197 Diarrhea, unspecified: Secondary | ICD-10-CM | POA: Diagnosis not present

## 2016-05-10 DIAGNOSIS — J019 Acute sinusitis, unspecified: Secondary | ICD-10-CM | POA: Diagnosis not present

## 2016-05-10 DIAGNOSIS — Z681 Body mass index (BMI) 19 or less, adult: Secondary | ICD-10-CM | POA: Diagnosis not present

## 2016-06-08 DIAGNOSIS — R42 Dizziness and giddiness: Secondary | ICD-10-CM | POA: Diagnosis not present

## 2016-06-08 DIAGNOSIS — I350 Nonrheumatic aortic (valve) stenosis: Secondary | ICD-10-CM | POA: Diagnosis not present

## 2016-06-08 DIAGNOSIS — R110 Nausea: Secondary | ICD-10-CM | POA: Diagnosis not present

## 2016-06-08 DIAGNOSIS — Z681 Body mass index (BMI) 19 or less, adult: Secondary | ICD-10-CM | POA: Diagnosis not present

## 2016-06-08 DIAGNOSIS — J019 Acute sinusitis, unspecified: Secondary | ICD-10-CM | POA: Diagnosis not present

## 2016-06-15 DIAGNOSIS — Z1231 Encounter for screening mammogram for malignant neoplasm of breast: Secondary | ICD-10-CM | POA: Diagnosis not present

## 2016-06-15 DIAGNOSIS — E559 Vitamin D deficiency, unspecified: Secondary | ICD-10-CM | POA: Diagnosis not present

## 2016-06-15 DIAGNOSIS — E785 Hyperlipidemia, unspecified: Secondary | ICD-10-CM | POA: Diagnosis not present

## 2016-06-15 DIAGNOSIS — E2839 Other primary ovarian failure: Secondary | ICD-10-CM | POA: Diagnosis not present

## 2016-06-15 DIAGNOSIS — D649 Anemia, unspecified: Secondary | ICD-10-CM | POA: Diagnosis not present

## 2016-06-15 DIAGNOSIS — K219 Gastro-esophageal reflux disease without esophagitis: Secondary | ICD-10-CM | POA: Diagnosis not present

## 2016-06-15 DIAGNOSIS — J449 Chronic obstructive pulmonary disease, unspecified: Secondary | ICD-10-CM | POA: Diagnosis not present

## 2016-06-15 DIAGNOSIS — Z1211 Encounter for screening for malignant neoplasm of colon: Secondary | ICD-10-CM | POA: Diagnosis not present

## 2016-06-15 DIAGNOSIS — Z681 Body mass index (BMI) 19 or less, adult: Secondary | ICD-10-CM | POA: Diagnosis not present

## 2016-06-16 ENCOUNTER — Ambulatory Visit: Payer: Medicare Other | Admitting: Sports Medicine

## 2016-07-22 DIAGNOSIS — R2990 Unspecified symptoms and signs involving the nervous system: Secondary | ICD-10-CM | POA: Diagnosis not present

## 2016-07-22 DIAGNOSIS — Z681 Body mass index (BMI) 19 or less, adult: Secondary | ICD-10-CM | POA: Diagnosis not present

## 2016-07-23 DIAGNOSIS — Z880 Allergy status to penicillin: Secondary | ICD-10-CM | POA: Diagnosis not present

## 2016-07-23 DIAGNOSIS — Z79899 Other long term (current) drug therapy: Secondary | ICD-10-CM | POA: Diagnosis not present

## 2016-07-23 DIAGNOSIS — J449 Chronic obstructive pulmonary disease, unspecified: Secondary | ICD-10-CM | POA: Diagnosis not present

## 2016-07-23 DIAGNOSIS — Z885 Allergy status to narcotic agent status: Secondary | ICD-10-CM | POA: Diagnosis not present

## 2016-07-23 DIAGNOSIS — F17210 Nicotine dependence, cigarettes, uncomplicated: Secondary | ICD-10-CM | POA: Diagnosis not present

## 2016-07-23 DIAGNOSIS — I10 Essential (primary) hypertension: Secondary | ICD-10-CM | POA: Diagnosis not present

## 2016-07-23 DIAGNOSIS — I517 Cardiomegaly: Secondary | ICD-10-CM | POA: Diagnosis not present

## 2016-07-23 DIAGNOSIS — R42 Dizziness and giddiness: Secondary | ICD-10-CM | POA: Diagnosis not present

## 2016-07-26 DIAGNOSIS — R2990 Unspecified symptoms and signs involving the nervous system: Secondary | ICD-10-CM | POA: Diagnosis not present

## 2016-07-26 DIAGNOSIS — Z681 Body mass index (BMI) 19 or less, adult: Secondary | ICD-10-CM | POA: Diagnosis not present

## 2016-07-27 DIAGNOSIS — F3341 Major depressive disorder, recurrent, in partial remission: Secondary | ICD-10-CM | POA: Diagnosis not present

## 2016-08-03 DIAGNOSIS — N63 Unspecified lump in unspecified breast: Secondary | ICD-10-CM | POA: Diagnosis not present

## 2016-08-03 DIAGNOSIS — R110 Nausea: Secondary | ICD-10-CM | POA: Diagnosis not present

## 2016-08-03 DIAGNOSIS — N644 Mastodynia: Secondary | ICD-10-CM | POA: Diagnosis not present

## 2016-08-03 DIAGNOSIS — Z681 Body mass index (BMI) 19 or less, adult: Secondary | ICD-10-CM | POA: Diagnosis not present

## 2016-08-12 DIAGNOSIS — M25551 Pain in right hip: Secondary | ICD-10-CM | POA: Diagnosis not present

## 2016-08-12 DIAGNOSIS — J449 Chronic obstructive pulmonary disease, unspecified: Secondary | ICD-10-CM | POA: Diagnosis not present

## 2016-08-12 DIAGNOSIS — M5431 Sciatica, right side: Secondary | ICD-10-CM | POA: Diagnosis not present

## 2016-08-12 DIAGNOSIS — R531 Weakness: Secondary | ICD-10-CM | POA: Diagnosis not present

## 2016-08-12 DIAGNOSIS — I10 Essential (primary) hypertension: Secondary | ICD-10-CM | POA: Diagnosis not present

## 2016-08-12 DIAGNOSIS — E785 Hyperlipidemia, unspecified: Secondary | ICD-10-CM | POA: Diagnosis not present

## 2016-08-12 DIAGNOSIS — F17210 Nicotine dependence, cigarettes, uncomplicated: Secondary | ICD-10-CM | POA: Diagnosis not present

## 2016-08-26 DIAGNOSIS — R42 Dizziness and giddiness: Secondary | ICD-10-CM | POA: Diagnosis not present

## 2016-08-26 DIAGNOSIS — G2581 Restless legs syndrome: Secondary | ICD-10-CM | POA: Diagnosis not present

## 2016-08-26 DIAGNOSIS — R51 Headache: Secondary | ICD-10-CM | POA: Diagnosis not present

## 2016-08-27 DIAGNOSIS — F4310 Post-traumatic stress disorder, unspecified: Secondary | ICD-10-CM | POA: Diagnosis not present

## 2016-08-27 DIAGNOSIS — Z681 Body mass index (BMI) 19 or less, adult: Secondary | ICD-10-CM | POA: Diagnosis not present

## 2016-08-27 DIAGNOSIS — N644 Mastodynia: Secondary | ICD-10-CM | POA: Diagnosis not present

## 2016-09-03 DIAGNOSIS — N644 Mastodynia: Secondary | ICD-10-CM | POA: Diagnosis not present

## 2016-09-03 DIAGNOSIS — E785 Hyperlipidemia, unspecified: Secondary | ICD-10-CM | POA: Diagnosis not present

## 2016-09-03 DIAGNOSIS — I10 Essential (primary) hypertension: Secondary | ICD-10-CM | POA: Diagnosis not present

## 2016-09-03 DIAGNOSIS — N611 Abscess of the breast and nipple: Secondary | ICD-10-CM | POA: Diagnosis not present

## 2016-09-03 DIAGNOSIS — F17210 Nicotine dependence, cigarettes, uncomplicated: Secondary | ICD-10-CM | POA: Diagnosis not present

## 2016-09-03 DIAGNOSIS — J449 Chronic obstructive pulmonary disease, unspecified: Secondary | ICD-10-CM | POA: Diagnosis not present

## 2016-09-08 DIAGNOSIS — H25042 Posterior subcapsular polar age-related cataract, left eye: Secondary | ICD-10-CM | POA: Diagnosis not present

## 2016-09-08 DIAGNOSIS — H2512 Age-related nuclear cataract, left eye: Secondary | ICD-10-CM | POA: Diagnosis not present

## 2016-09-08 DIAGNOSIS — E119 Type 2 diabetes mellitus without complications: Secondary | ICD-10-CM | POA: Diagnosis not present

## 2016-09-08 DIAGNOSIS — H25812 Combined forms of age-related cataract, left eye: Secondary | ICD-10-CM | POA: Diagnosis not present

## 2016-09-08 DIAGNOSIS — Z01818 Encounter for other preprocedural examination: Secondary | ICD-10-CM | POA: Diagnosis not present

## 2016-09-14 DIAGNOSIS — N649 Disorder of breast, unspecified: Secondary | ICD-10-CM | POA: Diagnosis not present

## 2016-09-14 DIAGNOSIS — Z9889 Other specified postprocedural states: Secondary | ICD-10-CM | POA: Diagnosis not present

## 2016-09-14 DIAGNOSIS — I10 Essential (primary) hypertension: Secondary | ICD-10-CM | POA: Diagnosis not present

## 2016-09-14 DIAGNOSIS — J45909 Unspecified asthma, uncomplicated: Secondary | ICD-10-CM | POA: Diagnosis not present

## 2016-09-14 DIAGNOSIS — Z720 Tobacco use: Secondary | ICD-10-CM | POA: Diagnosis not present

## 2016-09-14 DIAGNOSIS — N611 Abscess of the breast and nipple: Secondary | ICD-10-CM | POA: Diagnosis not present

## 2016-09-14 DIAGNOSIS — Z79899 Other long term (current) drug therapy: Secondary | ICD-10-CM | POA: Diagnosis not present

## 2016-09-14 DIAGNOSIS — N63 Unspecified lump in unspecified breast: Secondary | ICD-10-CM | POA: Diagnosis not present

## 2016-09-17 DIAGNOSIS — R636 Underweight: Secondary | ICD-10-CM | POA: Diagnosis not present

## 2016-09-17 DIAGNOSIS — J449 Chronic obstructive pulmonary disease, unspecified: Secondary | ICD-10-CM | POA: Diagnosis not present

## 2016-09-17 DIAGNOSIS — J069 Acute upper respiratory infection, unspecified: Secondary | ICD-10-CM | POA: Diagnosis not present

## 2016-09-17 DIAGNOSIS — N6012 Diffuse cystic mastopathy of left breast: Secondary | ICD-10-CM | POA: Diagnosis not present

## 2016-09-17 DIAGNOSIS — Z681 Body mass index (BMI) 19 or less, adult: Secondary | ICD-10-CM | POA: Diagnosis not present

## 2016-09-22 DIAGNOSIS — J441 Chronic obstructive pulmonary disease with (acute) exacerbation: Secondary | ICD-10-CM | POA: Diagnosis not present

## 2016-10-01 DIAGNOSIS — J441 Chronic obstructive pulmonary disease with (acute) exacerbation: Secondary | ICD-10-CM | POA: Diagnosis not present

## 2016-10-01 DIAGNOSIS — M545 Low back pain: Secondary | ICD-10-CM | POA: Diagnosis not present

## 2016-10-01 DIAGNOSIS — R636 Underweight: Secondary | ICD-10-CM | POA: Diagnosis not present

## 2016-10-01 DIAGNOSIS — J019 Acute sinusitis, unspecified: Secondary | ICD-10-CM | POA: Diagnosis not present

## 2016-10-01 DIAGNOSIS — Z681 Body mass index (BMI) 19 or less, adult: Secondary | ICD-10-CM | POA: Diagnosis not present

## 2016-12-27 DIAGNOSIS — F4310 Post-traumatic stress disorder, unspecified: Secondary | ICD-10-CM | POA: Diagnosis not present

## 2017-02-11 DIAGNOSIS — F17210 Nicotine dependence, cigarettes, uncomplicated: Secondary | ICD-10-CM | POA: Diagnosis not present

## 2017-02-11 DIAGNOSIS — N644 Mastodynia: Secondary | ICD-10-CM | POA: Diagnosis not present

## 2017-02-11 DIAGNOSIS — Z79899 Other long term (current) drug therapy: Secondary | ICD-10-CM | POA: Diagnosis not present

## 2017-02-11 DIAGNOSIS — J449 Chronic obstructive pulmonary disease, unspecified: Secondary | ICD-10-CM | POA: Diagnosis not present

## 2017-02-11 DIAGNOSIS — Z885 Allergy status to narcotic agent status: Secondary | ICD-10-CM | POA: Diagnosis not present

## 2017-02-11 DIAGNOSIS — E785 Hyperlipidemia, unspecified: Secondary | ICD-10-CM | POA: Diagnosis not present

## 2017-02-11 DIAGNOSIS — M549 Dorsalgia, unspecified: Secondary | ICD-10-CM | POA: Diagnosis not present

## 2017-02-11 DIAGNOSIS — R6883 Chills (without fever): Secondary | ICD-10-CM | POA: Diagnosis not present

## 2017-02-11 DIAGNOSIS — I10 Essential (primary) hypertension: Secondary | ICD-10-CM | POA: Diagnosis not present

## 2017-02-11 DIAGNOSIS — N611 Abscess of the breast and nipple: Secondary | ICD-10-CM | POA: Diagnosis not present

## 2017-02-11 DIAGNOSIS — Z882 Allergy status to sulfonamides status: Secondary | ICD-10-CM | POA: Diagnosis not present

## 2017-02-11 DIAGNOSIS — Z880 Allergy status to penicillin: Secondary | ICD-10-CM | POA: Diagnosis not present

## 2017-03-20 DIAGNOSIS — Z79899 Other long term (current) drug therapy: Secondary | ICD-10-CM | POA: Diagnosis not present

## 2017-03-20 DIAGNOSIS — M79604 Pain in right leg: Secondary | ICD-10-CM | POA: Diagnosis not present

## 2017-03-20 DIAGNOSIS — R3915 Urgency of urination: Secondary | ICD-10-CM | POA: Diagnosis not present

## 2017-03-20 DIAGNOSIS — I110 Hypertensive heart disease with heart failure: Secondary | ICD-10-CM | POA: Diagnosis not present

## 2017-03-20 DIAGNOSIS — F17210 Nicotine dependence, cigarettes, uncomplicated: Secondary | ICD-10-CM | POA: Diagnosis not present

## 2017-03-20 DIAGNOSIS — E119 Type 2 diabetes mellitus without complications: Secondary | ICD-10-CM | POA: Diagnosis not present

## 2017-03-20 DIAGNOSIS — I509 Heart failure, unspecified: Secondary | ICD-10-CM | POA: Diagnosis not present

## 2017-03-20 DIAGNOSIS — J449 Chronic obstructive pulmonary disease, unspecified: Secondary | ICD-10-CM | POA: Diagnosis not present

## 2017-03-20 DIAGNOSIS — R410 Disorientation, unspecified: Secondary | ICD-10-CM | POA: Diagnosis not present

## 2017-03-20 DIAGNOSIS — R51 Headache: Secondary | ICD-10-CM | POA: Diagnosis not present

## 2017-03-20 DIAGNOSIS — E785 Hyperlipidemia, unspecified: Secondary | ICD-10-CM | POA: Diagnosis not present

## 2017-03-20 DIAGNOSIS — R200 Anesthesia of skin: Secondary | ICD-10-CM | POA: Diagnosis not present

## 2017-07-23 DIAGNOSIS — F17210 Nicotine dependence, cigarettes, uncomplicated: Secondary | ICD-10-CM | POA: Diagnosis not present

## 2017-07-23 DIAGNOSIS — Z882 Allergy status to sulfonamides status: Secondary | ICD-10-CM | POA: Diagnosis not present

## 2017-07-23 DIAGNOSIS — M545 Low back pain: Secondary | ICD-10-CM | POA: Diagnosis not present

## 2017-07-23 DIAGNOSIS — N3000 Acute cystitis without hematuria: Secondary | ICD-10-CM | POA: Diagnosis not present

## 2017-07-23 DIAGNOSIS — E785 Hyperlipidemia, unspecified: Secondary | ICD-10-CM | POA: Diagnosis not present

## 2017-07-23 DIAGNOSIS — Z885 Allergy status to narcotic agent status: Secondary | ICD-10-CM | POA: Diagnosis not present

## 2017-07-23 DIAGNOSIS — R1013 Epigastric pain: Secondary | ICD-10-CM | POA: Diagnosis not present

## 2017-07-23 DIAGNOSIS — I10 Essential (primary) hypertension: Secondary | ICD-10-CM | POA: Diagnosis not present

## 2017-07-23 DIAGNOSIS — Z79899 Other long term (current) drug therapy: Secondary | ICD-10-CM | POA: Diagnosis not present

## 2017-07-23 DIAGNOSIS — G8929 Other chronic pain: Secondary | ICD-10-CM | POA: Diagnosis not present

## 2017-07-23 DIAGNOSIS — N6452 Nipple discharge: Secondary | ICD-10-CM | POA: Diagnosis not present

## 2017-07-23 DIAGNOSIS — Z880 Allergy status to penicillin: Secondary | ICD-10-CM | POA: Diagnosis not present

## 2017-07-23 DIAGNOSIS — J449 Chronic obstructive pulmonary disease, unspecified: Secondary | ICD-10-CM | POA: Diagnosis not present

## 2017-08-12 DIAGNOSIS — F3341 Major depressive disorder, recurrent, in partial remission: Secondary | ICD-10-CM | POA: Diagnosis not present

## 2017-08-16 DIAGNOSIS — F4310 Post-traumatic stress disorder, unspecified: Secondary | ICD-10-CM | POA: Diagnosis not present

## 2017-10-01 DIAGNOSIS — M79605 Pain in left leg: Secondary | ICD-10-CM | POA: Diagnosis not present

## 2017-10-01 DIAGNOSIS — M25571 Pain in right ankle and joints of right foot: Secondary | ICD-10-CM | POA: Diagnosis not present

## 2017-10-01 DIAGNOSIS — M7989 Other specified soft tissue disorders: Secondary | ICD-10-CM | POA: Diagnosis not present

## 2017-10-01 DIAGNOSIS — S99912A Unspecified injury of left ankle, initial encounter: Secondary | ICD-10-CM | POA: Diagnosis not present

## 2017-10-01 DIAGNOSIS — M25572 Pain in left ankle and joints of left foot: Secondary | ICD-10-CM | POA: Diagnosis not present

## 2017-10-02 DIAGNOSIS — S93401A Sprain of unspecified ligament of right ankle, initial encounter: Secondary | ICD-10-CM | POA: Diagnosis not present

## 2017-10-02 DIAGNOSIS — S8992XA Unspecified injury of left lower leg, initial encounter: Secondary | ICD-10-CM | POA: Diagnosis not present

## 2017-10-02 DIAGNOSIS — M79662 Pain in left lower leg: Secondary | ICD-10-CM | POA: Diagnosis not present

## 2017-10-02 DIAGNOSIS — S93402A Sprain of unspecified ligament of left ankle, initial encounter: Secondary | ICD-10-CM | POA: Diagnosis not present

## 2017-10-14 DIAGNOSIS — R601 Generalized edema: Secondary | ICD-10-CM | POA: Diagnosis not present

## 2017-10-14 DIAGNOSIS — J449 Chronic obstructive pulmonary disease, unspecified: Secondary | ICD-10-CM | POA: Diagnosis not present

## 2017-10-14 DIAGNOSIS — R160 Hepatomegaly, not elsewhere classified: Secondary | ICD-10-CM | POA: Diagnosis not present

## 2017-10-14 DIAGNOSIS — N1330 Unspecified hydronephrosis: Secondary | ICD-10-CM | POA: Diagnosis not present

## 2018-01-19 DIAGNOSIS — F17210 Nicotine dependence, cigarettes, uncomplicated: Secondary | ICD-10-CM | POA: Diagnosis not present

## 2018-01-19 DIAGNOSIS — N611 Abscess of the breast and nipple: Secondary | ICD-10-CM | POA: Diagnosis not present

## 2018-01-19 DIAGNOSIS — J439 Emphysema, unspecified: Secondary | ICD-10-CM | POA: Diagnosis not present

## 2018-01-19 DIAGNOSIS — Z853 Personal history of malignant neoplasm of breast: Secondary | ICD-10-CM | POA: Diagnosis not present

## 2018-04-18 DIAGNOSIS — F17210 Nicotine dependence, cigarettes, uncomplicated: Secondary | ICD-10-CM | POA: Diagnosis not present

## 2018-04-18 DIAGNOSIS — M549 Dorsalgia, unspecified: Secondary | ICD-10-CM | POA: Diagnosis not present

## 2018-04-18 DIAGNOSIS — M546 Pain in thoracic spine: Secondary | ICD-10-CM | POA: Diagnosis not present

## 2018-04-23 DIAGNOSIS — R05 Cough: Secondary | ICD-10-CM | POA: Diagnosis not present

## 2018-04-23 DIAGNOSIS — J069 Acute upper respiratory infection, unspecified: Secondary | ICD-10-CM | POA: Diagnosis not present

## 2018-04-23 DIAGNOSIS — N390 Urinary tract infection, site not specified: Secondary | ICD-10-CM | POA: Diagnosis not present

## 2018-04-23 DIAGNOSIS — R319 Hematuria, unspecified: Secondary | ICD-10-CM | POA: Diagnosis not present

## 2018-04-23 DIAGNOSIS — F17210 Nicotine dependence, cigarettes, uncomplicated: Secondary | ICD-10-CM | POA: Diagnosis not present

## 2018-04-23 DIAGNOSIS — R32 Unspecified urinary incontinence: Secondary | ICD-10-CM | POA: Diagnosis not present

## 2018-08-22 DIAGNOSIS — S299XXA Unspecified injury of thorax, initial encounter: Secondary | ICD-10-CM | POA: Diagnosis not present

## 2018-08-22 DIAGNOSIS — R1010 Upper abdominal pain, unspecified: Secondary | ICD-10-CM | POA: Diagnosis not present

## 2018-08-22 DIAGNOSIS — J189 Pneumonia, unspecified organism: Secondary | ICD-10-CM | POA: Diagnosis not present

## 2018-08-22 DIAGNOSIS — J181 Lobar pneumonia, unspecified organism: Secondary | ICD-10-CM | POA: Diagnosis not present

## 2018-08-22 DIAGNOSIS — E876 Hypokalemia: Secondary | ICD-10-CM | POA: Diagnosis not present

## 2018-08-22 DIAGNOSIS — S0990XA Unspecified injury of head, initial encounter: Secondary | ICD-10-CM | POA: Diagnosis not present

## 2018-08-23 DIAGNOSIS — Z885 Allergy status to narcotic agent status: Secondary | ICD-10-CM | POA: Diagnosis not present

## 2018-08-23 DIAGNOSIS — I252 Old myocardial infarction: Secondary | ICD-10-CM | POA: Diagnosis not present

## 2018-08-23 DIAGNOSIS — E119 Type 2 diabetes mellitus without complications: Secondary | ICD-10-CM | POA: Diagnosis present

## 2018-08-23 DIAGNOSIS — M549 Dorsalgia, unspecified: Secondary | ICD-10-CM | POA: Diagnosis not present

## 2018-08-23 DIAGNOSIS — Z882 Allergy status to sulfonamides status: Secondary | ICD-10-CM | POA: Diagnosis not present

## 2018-08-23 DIAGNOSIS — Z23 Encounter for immunization: Secondary | ICD-10-CM | POA: Diagnosis not present

## 2018-08-23 DIAGNOSIS — Z880 Allergy status to penicillin: Secondary | ICD-10-CM | POA: Diagnosis not present

## 2018-08-23 DIAGNOSIS — J189 Pneumonia, unspecified organism: Secondary | ICD-10-CM | POA: Diagnosis not present

## 2018-08-23 DIAGNOSIS — J181 Lobar pneumonia, unspecified organism: Secondary | ICD-10-CM | POA: Diagnosis not present

## 2018-08-23 DIAGNOSIS — E43 Unspecified severe protein-calorie malnutrition: Secondary | ICD-10-CM | POA: Diagnosis present

## 2018-08-23 DIAGNOSIS — R1010 Upper abdominal pain, unspecified: Secondary | ICD-10-CM | POA: Diagnosis not present

## 2018-08-23 DIAGNOSIS — F4310 Post-traumatic stress disorder, unspecified: Secondary | ICD-10-CM | POA: Diagnosis present

## 2018-08-23 DIAGNOSIS — Z853 Personal history of malignant neoplasm of breast: Secondary | ICD-10-CM | POA: Diagnosis not present

## 2018-08-23 DIAGNOSIS — J449 Chronic obstructive pulmonary disease, unspecified: Secondary | ICD-10-CM | POA: Diagnosis present

## 2018-08-23 DIAGNOSIS — J156 Pneumonia due to other aerobic Gram-negative bacteria: Secondary | ICD-10-CM | POA: Diagnosis present

## 2018-08-23 DIAGNOSIS — F17210 Nicotine dependence, cigarettes, uncomplicated: Secondary | ICD-10-CM | POA: Diagnosis present

## 2018-08-23 DIAGNOSIS — S299XXA Unspecified injury of thorax, initial encounter: Secondary | ICD-10-CM | POA: Diagnosis not present

## 2018-08-23 DIAGNOSIS — Z681 Body mass index (BMI) 19 or less, adult: Secondary | ICD-10-CM | POA: Diagnosis not present

## 2018-08-23 DIAGNOSIS — E876 Hypokalemia: Secondary | ICD-10-CM | POA: Diagnosis present

## 2018-08-23 DIAGNOSIS — Z9013 Acquired absence of bilateral breasts and nipples: Secondary | ICD-10-CM | POA: Diagnosis not present

## 2018-08-23 DIAGNOSIS — J690 Pneumonitis due to inhalation of food and vomit: Secondary | ICD-10-CM | POA: Diagnosis not present

## 2018-08-23 DIAGNOSIS — S0990XA Unspecified injury of head, initial encounter: Secondary | ICD-10-CM | POA: Diagnosis not present

## 2018-08-23 DIAGNOSIS — Z8673 Personal history of transient ischemic attack (TIA), and cerebral infarction without residual deficits: Secondary | ICD-10-CM | POA: Diagnosis not present

## 2018-08-23 DIAGNOSIS — D649 Anemia, unspecified: Secondary | ICD-10-CM | POA: Diagnosis present

## 2018-08-25 DIAGNOSIS — J449 Chronic obstructive pulmonary disease, unspecified: Secondary | ICD-10-CM | POA: Diagnosis not present

## 2018-08-25 DIAGNOSIS — Z7951 Long term (current) use of inhaled steroids: Secondary | ICD-10-CM | POA: Diagnosis not present

## 2018-08-25 DIAGNOSIS — Z8673 Personal history of transient ischemic attack (TIA), and cerebral infarction without residual deficits: Secondary | ICD-10-CM | POA: Diagnosis not present

## 2018-08-25 DIAGNOSIS — F4310 Post-traumatic stress disorder, unspecified: Secondary | ICD-10-CM | POA: Diagnosis not present

## 2018-08-25 DIAGNOSIS — I252 Old myocardial infarction: Secondary | ICD-10-CM | POA: Diagnosis not present

## 2018-08-25 DIAGNOSIS — E43 Unspecified severe protein-calorie malnutrition: Secondary | ICD-10-CM | POA: Diagnosis not present

## 2018-08-25 DIAGNOSIS — Z9013 Acquired absence of bilateral breasts and nipples: Secondary | ICD-10-CM | POA: Diagnosis not present

## 2018-08-25 DIAGNOSIS — M549 Dorsalgia, unspecified: Secondary | ICD-10-CM | POA: Diagnosis not present

## 2018-08-25 DIAGNOSIS — Z7952 Long term (current) use of systemic steroids: Secondary | ICD-10-CM | POA: Diagnosis not present

## 2018-08-25 DIAGNOSIS — E119 Type 2 diabetes mellitus without complications: Secondary | ICD-10-CM | POA: Diagnosis not present

## 2018-08-25 DIAGNOSIS — J189 Pneumonia, unspecified organism: Secondary | ICD-10-CM | POA: Diagnosis not present

## 2018-08-25 DIAGNOSIS — F17210 Nicotine dependence, cigarettes, uncomplicated: Secondary | ICD-10-CM | POA: Diagnosis not present

## 2018-08-25 DIAGNOSIS — F329 Major depressive disorder, single episode, unspecified: Secondary | ICD-10-CM | POA: Diagnosis not present

## 2018-08-25 DIAGNOSIS — J440 Chronic obstructive pulmonary disease with acute lower respiratory infection: Secondary | ICD-10-CM | POA: Diagnosis not present

## 2018-08-25 DIAGNOSIS — I509 Heart failure, unspecified: Secondary | ICD-10-CM | POA: Diagnosis not present

## 2018-08-25 DIAGNOSIS — Z853 Personal history of malignant neoplasm of breast: Secondary | ICD-10-CM | POA: Diagnosis not present

## 2018-08-27 DIAGNOSIS — J189 Pneumonia, unspecified organism: Secondary | ICD-10-CM | POA: Diagnosis not present

## 2018-08-27 DIAGNOSIS — R609 Edema, unspecified: Secondary | ICD-10-CM | POA: Diagnosis not present

## 2018-08-30 DIAGNOSIS — J440 Chronic obstructive pulmonary disease with acute lower respiratory infection: Secondary | ICD-10-CM | POA: Diagnosis not present

## 2018-08-30 DIAGNOSIS — F329 Major depressive disorder, single episode, unspecified: Secondary | ICD-10-CM | POA: Diagnosis not present

## 2018-08-30 DIAGNOSIS — I509 Heart failure, unspecified: Secondary | ICD-10-CM | POA: Diagnosis not present

## 2018-08-30 DIAGNOSIS — J189 Pneumonia, unspecified organism: Secondary | ICD-10-CM | POA: Diagnosis not present

## 2018-08-30 DIAGNOSIS — F4310 Post-traumatic stress disorder, unspecified: Secondary | ICD-10-CM | POA: Diagnosis not present

## 2018-08-30 DIAGNOSIS — M549 Dorsalgia, unspecified: Secondary | ICD-10-CM | POA: Diagnosis not present

## 2018-08-31 DIAGNOSIS — F410 Panic disorder [episodic paroxysmal anxiety] without agoraphobia: Secondary | ICD-10-CM | POA: Diagnosis not present

## 2018-08-31 DIAGNOSIS — F329 Major depressive disorder, single episode, unspecified: Secondary | ICD-10-CM | POA: Diagnosis not present

## 2018-08-31 DIAGNOSIS — J189 Pneumonia, unspecified organism: Secondary | ICD-10-CM | POA: Diagnosis not present

## 2018-08-31 DIAGNOSIS — J440 Chronic obstructive pulmonary disease with acute lower respiratory infection: Secondary | ICD-10-CM | POA: Diagnosis not present

## 2018-08-31 DIAGNOSIS — M549 Dorsalgia, unspecified: Secondary | ICD-10-CM | POA: Diagnosis not present

## 2018-08-31 DIAGNOSIS — B009 Herpesviral infection, unspecified: Secondary | ICD-10-CM | POA: Diagnosis not present

## 2018-08-31 DIAGNOSIS — I509 Heart failure, unspecified: Secondary | ICD-10-CM | POA: Diagnosis not present

## 2018-08-31 DIAGNOSIS — Z681 Body mass index (BMI) 19 or less, adult: Secondary | ICD-10-CM | POA: Diagnosis not present

## 2018-08-31 DIAGNOSIS — F4310 Post-traumatic stress disorder, unspecified: Secondary | ICD-10-CM | POA: Diagnosis not present

## 2018-09-01 DIAGNOSIS — B009 Herpesviral infection, unspecified: Secondary | ICD-10-CM | POA: Diagnosis not present

## 2018-09-01 DIAGNOSIS — I509 Heart failure, unspecified: Secondary | ICD-10-CM | POA: Diagnosis not present

## 2018-09-01 DIAGNOSIS — M549 Dorsalgia, unspecified: Secondary | ICD-10-CM | POA: Diagnosis not present

## 2018-09-01 DIAGNOSIS — F329 Major depressive disorder, single episode, unspecified: Secondary | ICD-10-CM | POA: Diagnosis not present

## 2018-09-01 DIAGNOSIS — F4310 Post-traumatic stress disorder, unspecified: Secondary | ICD-10-CM | POA: Diagnosis not present

## 2018-09-01 DIAGNOSIS — J189 Pneumonia, unspecified organism: Secondary | ICD-10-CM | POA: Diagnosis not present

## 2018-09-01 DIAGNOSIS — J440 Chronic obstructive pulmonary disease with acute lower respiratory infection: Secondary | ICD-10-CM | POA: Diagnosis not present

## 2018-09-05 DIAGNOSIS — J440 Chronic obstructive pulmonary disease with acute lower respiratory infection: Secondary | ICD-10-CM | POA: Diagnosis not present

## 2018-09-05 DIAGNOSIS — I509 Heart failure, unspecified: Secondary | ICD-10-CM | POA: Diagnosis not present

## 2018-09-05 DIAGNOSIS — J189 Pneumonia, unspecified organism: Secondary | ICD-10-CM | POA: Diagnosis not present

## 2018-09-05 DIAGNOSIS — F4310 Post-traumatic stress disorder, unspecified: Secondary | ICD-10-CM | POA: Diagnosis not present

## 2018-09-05 DIAGNOSIS — F329 Major depressive disorder, single episode, unspecified: Secondary | ICD-10-CM | POA: Diagnosis not present

## 2018-09-05 DIAGNOSIS — M549 Dorsalgia, unspecified: Secondary | ICD-10-CM | POA: Diagnosis not present

## 2018-09-07 DIAGNOSIS — F419 Anxiety disorder, unspecified: Secondary | ICD-10-CM | POA: Diagnosis not present

## 2018-09-07 DIAGNOSIS — J189 Pneumonia, unspecified organism: Secondary | ICD-10-CM | POA: Diagnosis not present

## 2018-09-07 DIAGNOSIS — F329 Major depressive disorder, single episode, unspecified: Secondary | ICD-10-CM | POA: Diagnosis not present

## 2018-09-07 DIAGNOSIS — R110 Nausea: Secondary | ICD-10-CM | POA: Diagnosis not present

## 2018-09-07 DIAGNOSIS — I509 Heart failure, unspecified: Secondary | ICD-10-CM | POA: Diagnosis not present

## 2018-09-07 DIAGNOSIS — F4310 Post-traumatic stress disorder, unspecified: Secondary | ICD-10-CM | POA: Diagnosis not present

## 2018-09-07 DIAGNOSIS — B009 Herpesviral infection, unspecified: Secondary | ICD-10-CM | POA: Diagnosis not present

## 2018-09-07 DIAGNOSIS — M549 Dorsalgia, unspecified: Secondary | ICD-10-CM | POA: Diagnosis not present

## 2018-09-07 DIAGNOSIS — J440 Chronic obstructive pulmonary disease with acute lower respiratory infection: Secondary | ICD-10-CM | POA: Diagnosis not present

## 2018-09-08 DIAGNOSIS — F4310 Post-traumatic stress disorder, unspecified: Secondary | ICD-10-CM | POA: Diagnosis not present

## 2018-09-08 DIAGNOSIS — J189 Pneumonia, unspecified organism: Secondary | ICD-10-CM | POA: Diagnosis not present

## 2018-09-08 DIAGNOSIS — F329 Major depressive disorder, single episode, unspecified: Secondary | ICD-10-CM | POA: Diagnosis not present

## 2018-09-08 DIAGNOSIS — I509 Heart failure, unspecified: Secondary | ICD-10-CM | POA: Diagnosis not present

## 2018-09-08 DIAGNOSIS — J440 Chronic obstructive pulmonary disease with acute lower respiratory infection: Secondary | ICD-10-CM | POA: Diagnosis not present

## 2018-09-08 DIAGNOSIS — M549 Dorsalgia, unspecified: Secondary | ICD-10-CM | POA: Diagnosis not present

## 2018-09-12 DIAGNOSIS — F4310 Post-traumatic stress disorder, unspecified: Secondary | ICD-10-CM | POA: Diagnosis not present

## 2018-09-12 DIAGNOSIS — F329 Major depressive disorder, single episode, unspecified: Secondary | ICD-10-CM | POA: Diagnosis not present

## 2018-09-12 DIAGNOSIS — J440 Chronic obstructive pulmonary disease with acute lower respiratory infection: Secondary | ICD-10-CM | POA: Diagnosis not present

## 2018-09-12 DIAGNOSIS — I509 Heart failure, unspecified: Secondary | ICD-10-CM | POA: Diagnosis not present

## 2018-09-12 DIAGNOSIS — J189 Pneumonia, unspecified organism: Secondary | ICD-10-CM | POA: Diagnosis not present

## 2018-09-12 DIAGNOSIS — M549 Dorsalgia, unspecified: Secondary | ICD-10-CM | POA: Diagnosis not present

## 2018-09-13 DIAGNOSIS — J440 Chronic obstructive pulmonary disease with acute lower respiratory infection: Secondary | ICD-10-CM | POA: Diagnosis not present

## 2018-09-13 DIAGNOSIS — I509 Heart failure, unspecified: Secondary | ICD-10-CM | POA: Diagnosis not present

## 2018-09-13 DIAGNOSIS — M549 Dorsalgia, unspecified: Secondary | ICD-10-CM | POA: Diagnosis not present

## 2018-09-13 DIAGNOSIS — F4310 Post-traumatic stress disorder, unspecified: Secondary | ICD-10-CM | POA: Diagnosis not present

## 2018-09-13 DIAGNOSIS — J189 Pneumonia, unspecified organism: Secondary | ICD-10-CM | POA: Diagnosis not present

## 2018-09-13 DIAGNOSIS — F329 Major depressive disorder, single episode, unspecified: Secondary | ICD-10-CM | POA: Diagnosis not present

## 2018-09-14 DIAGNOSIS — H538 Other visual disturbances: Secondary | ICD-10-CM | POA: Diagnosis not present

## 2018-09-14 DIAGNOSIS — G459 Transient cerebral ischemic attack, unspecified: Secondary | ICD-10-CM | POA: Diagnosis not present

## 2018-09-14 DIAGNOSIS — H539 Unspecified visual disturbance: Secondary | ICD-10-CM | POA: Diagnosis not present

## 2018-09-18 DIAGNOSIS — F329 Major depressive disorder, single episode, unspecified: Secondary | ICD-10-CM | POA: Diagnosis not present

## 2018-09-18 DIAGNOSIS — I509 Heart failure, unspecified: Secondary | ICD-10-CM | POA: Diagnosis not present

## 2018-09-18 DIAGNOSIS — M549 Dorsalgia, unspecified: Secondary | ICD-10-CM | POA: Diagnosis not present

## 2018-09-18 DIAGNOSIS — J440 Chronic obstructive pulmonary disease with acute lower respiratory infection: Secondary | ICD-10-CM | POA: Diagnosis not present

## 2018-09-18 DIAGNOSIS — F4310 Post-traumatic stress disorder, unspecified: Secondary | ICD-10-CM | POA: Diagnosis not present

## 2018-09-18 DIAGNOSIS — J189 Pneumonia, unspecified organism: Secondary | ICD-10-CM | POA: Diagnosis not present

## 2018-09-20 DIAGNOSIS — F4310 Post-traumatic stress disorder, unspecified: Secondary | ICD-10-CM | POA: Diagnosis not present

## 2018-09-20 DIAGNOSIS — I509 Heart failure, unspecified: Secondary | ICD-10-CM | POA: Diagnosis not present

## 2018-09-20 DIAGNOSIS — M549 Dorsalgia, unspecified: Secondary | ICD-10-CM | POA: Diagnosis not present

## 2018-09-20 DIAGNOSIS — J440 Chronic obstructive pulmonary disease with acute lower respiratory infection: Secondary | ICD-10-CM | POA: Diagnosis not present

## 2018-09-20 DIAGNOSIS — F329 Major depressive disorder, single episode, unspecified: Secondary | ICD-10-CM | POA: Diagnosis not present

## 2018-09-20 DIAGNOSIS — J189 Pneumonia, unspecified organism: Secondary | ICD-10-CM | POA: Diagnosis not present

## 2018-09-21 DIAGNOSIS — M549 Dorsalgia, unspecified: Secondary | ICD-10-CM | POA: Diagnosis not present

## 2018-09-21 DIAGNOSIS — I509 Heart failure, unspecified: Secondary | ICD-10-CM | POA: Diagnosis not present

## 2018-09-21 DIAGNOSIS — F329 Major depressive disorder, single episode, unspecified: Secondary | ICD-10-CM | POA: Diagnosis not present

## 2018-09-21 DIAGNOSIS — J440 Chronic obstructive pulmonary disease with acute lower respiratory infection: Secondary | ICD-10-CM | POA: Diagnosis not present

## 2018-09-21 DIAGNOSIS — J189 Pneumonia, unspecified organism: Secondary | ICD-10-CM | POA: Diagnosis not present

## 2018-09-21 DIAGNOSIS — F4310 Post-traumatic stress disorder, unspecified: Secondary | ICD-10-CM | POA: Diagnosis not present

## 2018-09-24 DIAGNOSIS — J189 Pneumonia, unspecified organism: Secondary | ICD-10-CM | POA: Diagnosis not present

## 2018-09-24 DIAGNOSIS — F17210 Nicotine dependence, cigarettes, uncomplicated: Secondary | ICD-10-CM | POA: Diagnosis not present

## 2018-09-24 DIAGNOSIS — F4310 Post-traumatic stress disorder, unspecified: Secondary | ICD-10-CM | POA: Diagnosis not present

## 2018-09-24 DIAGNOSIS — E43 Unspecified severe protein-calorie malnutrition: Secondary | ICD-10-CM | POA: Diagnosis not present

## 2018-09-24 DIAGNOSIS — Z8673 Personal history of transient ischemic attack (TIA), and cerebral infarction without residual deficits: Secondary | ICD-10-CM | POA: Diagnosis not present

## 2018-09-24 DIAGNOSIS — I509 Heart failure, unspecified: Secondary | ICD-10-CM | POA: Diagnosis not present

## 2018-09-24 DIAGNOSIS — Z7952 Long term (current) use of systemic steroids: Secondary | ICD-10-CM | POA: Diagnosis not present

## 2018-09-24 DIAGNOSIS — I252 Old myocardial infarction: Secondary | ICD-10-CM | POA: Diagnosis not present

## 2018-09-24 DIAGNOSIS — Z9013 Acquired absence of bilateral breasts and nipples: Secondary | ICD-10-CM | POA: Diagnosis not present

## 2018-09-24 DIAGNOSIS — Z7951 Long term (current) use of inhaled steroids: Secondary | ICD-10-CM | POA: Diagnosis not present

## 2018-09-24 DIAGNOSIS — E119 Type 2 diabetes mellitus without complications: Secondary | ICD-10-CM | POA: Diagnosis not present

## 2018-09-24 DIAGNOSIS — J440 Chronic obstructive pulmonary disease with acute lower respiratory infection: Secondary | ICD-10-CM | POA: Diagnosis not present

## 2018-09-24 DIAGNOSIS — Z853 Personal history of malignant neoplasm of breast: Secondary | ICD-10-CM | POA: Diagnosis not present

## 2018-09-24 DIAGNOSIS — F329 Major depressive disorder, single episode, unspecified: Secondary | ICD-10-CM | POA: Diagnosis not present

## 2018-09-24 DIAGNOSIS — M549 Dorsalgia, unspecified: Secondary | ICD-10-CM | POA: Diagnosis not present

## 2018-09-26 DIAGNOSIS — M549 Dorsalgia, unspecified: Secondary | ICD-10-CM | POA: Diagnosis not present

## 2018-09-26 DIAGNOSIS — J189 Pneumonia, unspecified organism: Secondary | ICD-10-CM | POA: Diagnosis not present

## 2018-09-26 DIAGNOSIS — J440 Chronic obstructive pulmonary disease with acute lower respiratory infection: Secondary | ICD-10-CM | POA: Diagnosis not present

## 2018-09-26 DIAGNOSIS — F4310 Post-traumatic stress disorder, unspecified: Secondary | ICD-10-CM | POA: Diagnosis not present

## 2018-09-26 DIAGNOSIS — F329 Major depressive disorder, single episode, unspecified: Secondary | ICD-10-CM | POA: Diagnosis not present

## 2018-09-26 DIAGNOSIS — I509 Heart failure, unspecified: Secondary | ICD-10-CM | POA: Diagnosis not present

## 2018-10-02 DIAGNOSIS — F419 Anxiety disorder, unspecified: Secondary | ICD-10-CM | POA: Diagnosis not present

## 2018-10-02 DIAGNOSIS — N3001 Acute cystitis with hematuria: Secondary | ICD-10-CM | POA: Diagnosis not present

## 2018-10-02 DIAGNOSIS — K219 Gastro-esophageal reflux disease without esophagitis: Secondary | ICD-10-CM | POA: Diagnosis not present

## 2018-10-02 DIAGNOSIS — J189 Pneumonia, unspecified organism: Secondary | ICD-10-CM | POA: Diagnosis not present

## 2018-10-03 DIAGNOSIS — J189 Pneumonia, unspecified organism: Secondary | ICD-10-CM | POA: Diagnosis not present

## 2018-10-03 DIAGNOSIS — F4310 Post-traumatic stress disorder, unspecified: Secondary | ICD-10-CM | POA: Diagnosis not present

## 2018-10-03 DIAGNOSIS — J440 Chronic obstructive pulmonary disease with acute lower respiratory infection: Secondary | ICD-10-CM | POA: Diagnosis not present

## 2018-10-03 DIAGNOSIS — I509 Heart failure, unspecified: Secondary | ICD-10-CM | POA: Diagnosis not present

## 2018-10-03 DIAGNOSIS — F329 Major depressive disorder, single episode, unspecified: Secondary | ICD-10-CM | POA: Diagnosis not present

## 2018-10-03 DIAGNOSIS — M549 Dorsalgia, unspecified: Secondary | ICD-10-CM | POA: Diagnosis not present

## 2018-10-04 DIAGNOSIS — M549 Dorsalgia, unspecified: Secondary | ICD-10-CM | POA: Diagnosis not present

## 2018-10-04 DIAGNOSIS — I509 Heart failure, unspecified: Secondary | ICD-10-CM | POA: Diagnosis not present

## 2018-10-04 DIAGNOSIS — F329 Major depressive disorder, single episode, unspecified: Secondary | ICD-10-CM | POA: Diagnosis not present

## 2018-10-04 DIAGNOSIS — J440 Chronic obstructive pulmonary disease with acute lower respiratory infection: Secondary | ICD-10-CM | POA: Diagnosis not present

## 2018-10-04 DIAGNOSIS — J189 Pneumonia, unspecified organism: Secondary | ICD-10-CM | POA: Diagnosis not present

## 2018-10-04 DIAGNOSIS — F4310 Post-traumatic stress disorder, unspecified: Secondary | ICD-10-CM | POA: Diagnosis not present

## 2018-10-06 DIAGNOSIS — M549 Dorsalgia, unspecified: Secondary | ICD-10-CM | POA: Diagnosis not present

## 2018-10-06 DIAGNOSIS — I509 Heart failure, unspecified: Secondary | ICD-10-CM | POA: Diagnosis not present

## 2018-10-06 DIAGNOSIS — F4310 Post-traumatic stress disorder, unspecified: Secondary | ICD-10-CM | POA: Diagnosis not present

## 2018-10-06 DIAGNOSIS — F329 Major depressive disorder, single episode, unspecified: Secondary | ICD-10-CM | POA: Diagnosis not present

## 2018-10-06 DIAGNOSIS — J440 Chronic obstructive pulmonary disease with acute lower respiratory infection: Secondary | ICD-10-CM | POA: Diagnosis not present

## 2018-10-06 DIAGNOSIS — J189 Pneumonia, unspecified organism: Secondary | ICD-10-CM | POA: Diagnosis not present

## 2018-10-17 DIAGNOSIS — M549 Dorsalgia, unspecified: Secondary | ICD-10-CM | POA: Diagnosis not present

## 2018-10-17 DIAGNOSIS — I509 Heart failure, unspecified: Secondary | ICD-10-CM | POA: Diagnosis not present

## 2018-10-17 DIAGNOSIS — F4310 Post-traumatic stress disorder, unspecified: Secondary | ICD-10-CM | POA: Diagnosis not present

## 2018-10-17 DIAGNOSIS — F329 Major depressive disorder, single episode, unspecified: Secondary | ICD-10-CM | POA: Diagnosis not present

## 2018-10-17 DIAGNOSIS — J440 Chronic obstructive pulmonary disease with acute lower respiratory infection: Secondary | ICD-10-CM | POA: Diagnosis not present

## 2018-10-17 DIAGNOSIS — J189 Pneumonia, unspecified organism: Secondary | ICD-10-CM | POA: Diagnosis not present

## 2018-11-16 DIAGNOSIS — Z681 Body mass index (BMI) 19 or less, adult: Secondary | ICD-10-CM | POA: Diagnosis not present

## 2018-11-16 DIAGNOSIS — Z79899 Other long term (current) drug therapy: Secondary | ICD-10-CM | POA: Diagnosis not present

## 2018-11-16 DIAGNOSIS — J441 Chronic obstructive pulmonary disease with (acute) exacerbation: Secondary | ICD-10-CM | POA: Diagnosis not present

## 2018-11-16 DIAGNOSIS — R42 Dizziness and giddiness: Secondary | ICD-10-CM | POA: Diagnosis not present

## 2018-11-16 DIAGNOSIS — K219 Gastro-esophageal reflux disease without esophagitis: Secondary | ICD-10-CM | POA: Diagnosis not present

## 2018-11-24 DIAGNOSIS — Z681 Body mass index (BMI) 19 or less, adult: Secondary | ICD-10-CM | POA: Diagnosis not present

## 2018-11-24 DIAGNOSIS — J441 Chronic obstructive pulmonary disease with (acute) exacerbation: Secondary | ICD-10-CM | POA: Diagnosis not present

## 2018-11-24 DIAGNOSIS — F419 Anxiety disorder, unspecified: Secondary | ICD-10-CM | POA: Diagnosis not present

## 2018-11-24 DIAGNOSIS — L989 Disorder of the skin and subcutaneous tissue, unspecified: Secondary | ICD-10-CM | POA: Diagnosis not present

## 2018-12-11 DIAGNOSIS — R011 Cardiac murmur, unspecified: Secondary | ICD-10-CM | POA: Diagnosis not present

## 2018-12-11 DIAGNOSIS — N3001 Acute cystitis with hematuria: Secondary | ICD-10-CM | POA: Diagnosis not present

## 2018-12-11 DIAGNOSIS — F419 Anxiety disorder, unspecified: Secondary | ICD-10-CM | POA: Diagnosis not present

## 2018-12-11 DIAGNOSIS — H5310 Unspecified subjective visual disturbances: Secondary | ICD-10-CM | POA: Diagnosis not present

## 2018-12-12 DIAGNOSIS — N3001 Acute cystitis with hematuria: Secondary | ICD-10-CM | POA: Diagnosis not present

## 2018-12-15 DIAGNOSIS — Z6820 Body mass index (BMI) 20.0-20.9, adult: Secondary | ICD-10-CM | POA: Diagnosis not present

## 2018-12-15 DIAGNOSIS — R600 Localized edema: Secondary | ICD-10-CM | POA: Diagnosis not present

## 2019-01-08 ENCOUNTER — Ambulatory Visit: Payer: Medicare Other | Admitting: Cardiology

## 2019-01-11 ENCOUNTER — Ambulatory Visit: Payer: Medicare Other | Admitting: Cardiology

## 2019-01-11 ENCOUNTER — Encounter: Payer: Self-pay | Admitting: Cardiology

## 2019-01-11 NOTE — Progress Notes (Deleted)
Cardiology Office Note:    Date:  01/11/2019   ID:  [name], DOB 10-04-1965, MRN [id number]  PCP:  Suzan Garibaldi, FNP  Cardiologist:  Shirlee More, MD   Referring MD: Serita Grammes, MD  ASSESSMENT:    1. Murmur   2. Tobacco abuse    PLAN:    In order of problems listed above:  1. Murmur -  2. Tobacco abuse -   Next appointment   Medication Adjustments/Labs and Tests Ordered: Current medicines are reviewed at length with the patient today.  Concerns regarding medicines are outlined above.  No orders of the defined types were placed in this encounter.  No orders of the defined types were placed in this encounter.    No chief complaint on file. Chief Complaint: 53 yo female presents for consultation of murmur.   History of Present Illness:    [name] is a 53 y.o. female who is being seen today for the evaluation of murmur at the request of Serita Grammes, MD. She is a current everyday smoker. She was seen by her PCP 12/11/18 for follow up of mood with chief complaint of urinary incontinence and suprapublic pain, started on Macrobid She was noted to have cardiac murmur and subsequently referred. She has a family history notable for HTN.    Past Medical History:  Diagnosis Date  . Anemia   . Anxiety   . Arthritis   . Asthma   . CHF (congestive heart failure) (HCC)    hx CHF 1 year ago in [facility]  . COPD (chronic obstructive pulmonary disease) ([place])   . Cough with congestion of paranasal sinus   . Depression   . Diabetes mellitus without complication ([place])    on meds  . GERD (gastroesophageal reflux disease)   . Heart murmur   . Pneumonia 2015  . Sinus congestion     Past Surgical History:  Procedure Laterality Date  . ABDOMINAL HYSTERECTOMY  1998   tubal pregnancy right side partial hysterectomy  . APPENDECTOMY    . BREAST SURGERY Bilateral    chronic mastitis  . CATARACT EXTRACTION W/ INTRAOCULAR LENS  IMPLANT, BILATERAL     . MULTIPLE EXTRACTIONS WITH ALVEOLOPLASTY N/A 08/09/2014   Procedure: MULTIPLE EXTRACTIONS WITH ALVEOLOPLASTY ;  Surgeon: Diona Browner, DDS;  Location: [place];  Service: Oral Surgery;  Laterality: N/A;    Current Medications: No outpatient medications have been marked as taking for the 01/11/19 encounter (Appointment) with Richardo Priest, MD.     Allergies:   Clarithromycin, Propoxyphene, Sulfamethoxazole, Chocolate, Penicillins, Percocet [oxycodone-acetaminophen], Ranitidine, Sulfa antibiotics, Tape, and Codeine   Social History   Socioeconomic History  . Marital status: Legally Separated    Spouse name: Not on file  . Number of children: Not on file  . Years of education: Not on file  . Highest education level: Not on file  Occupational History  . Not on file  Social Needs  . Financial resource strain: Not on file  . Food insecurity    Worry: Not on file    Inability: Not on file  . Transportation needs    Medical: Not on file    Non-medical: Not on file  Tobacco Use  . Smoking status: Current Every Day Smoker    Packs/day: 0.25    Years: 32.00    Pack years: 8.00    Types: Cigarettes  . Smokeless tobacco: Never Used  Substance and Sexual Activity  . Alcohol use: No  .  Drug use: No  . Sexual activity: Not on file  Lifestyle  . Physical activity    Days per week: Not on file    Minutes per session: Not on file  . Stress: Not on file  Relationships  . Social Herbalist on phone: Not on file    Gets together: Not on file    Attends religious service: Not on file    Active member of club or organization: Not on file    Attends meetings of clubs or organizations: Not on file    Relationship status: Not on file  Other Topics Concern  . Not on file  Social History Narrative  . Not on file     Family History: The patient's ***family history includes Hypertension in her father and mother; Skin cancer in her maternal grandmother and mother; Stroke in her  mother.  ROS:   ROS Please see the history of present illness.    *** All other systems reviewed and are negative.  EKGs/Labs/Other Studies Reviewed:    The following studies were reviewed today: ***  EKG:  EKG is *** ordered today.  The ekg ordered today is personally reviewed and demonstrates ***  Recent Labs: No results found for requested labs within last 8760 hours.  Recent Lipid Panel No results found for: CHOL, TRIG, HDL, CHOLHDL, VLDL, LDLCALC, LDLDIRECT  Physical Exam:    VS:  There were no vitals taken for this visit.    Wt Readings from Last 3 Encounters:  08/09/14 144 lb (65.3 kg)  08/05/14 144 lb 6.4 oz (65.5 kg)     GEN: *** Well nourished, well developed in no acute distress HEENT: Normal NECK: No JVD; No carotid bruits LYMPHATICS: No lymphadenopathy CARDIAC: ***RRR, no murmurs, rubs, gallops RESPIRATORY:  Clear to auscultation without rales, wheezing or rhonchi  ABDOMEN: Soft, non-tender, non-distended MUSCULOSKELETAL:  No edema; No deformity  SKIN: Warm and dry NEUROLOGIC:  Alert and oriented x 3 PSYCHIATRIC:  Normal affect     Signed, Shirlee More, MD  01/11/2019 8:17 AM    [place]

## 2019-03-22 DIAGNOSIS — H33051 Total retinal detachment, right eye: Secondary | ICD-10-CM | POA: Diagnosis not present

## 2019-04-16 DIAGNOSIS — H33031 Retinal detachment with giant retinal tear, right eye: Secondary | ICD-10-CM | POA: Diagnosis not present

## 2019-04-16 DIAGNOSIS — H33051 Total retinal detachment, right eye: Secondary | ICD-10-CM | POA: Diagnosis not present

## 2019-04-16 DIAGNOSIS — Z961 Presence of intraocular lens: Secondary | ICD-10-CM | POA: Diagnosis not present

## 2019-08-31 DIAGNOSIS — Z20828 Contact with and (suspected) exposure to other viral communicable diseases: Secondary | ICD-10-CM | POA: Diagnosis not present

## 2019-08-31 DIAGNOSIS — N6342 Unspecified lump in left breast, subareolar: Secondary | ICD-10-CM | POA: Diagnosis not present

## 2019-08-31 DIAGNOSIS — R0981 Nasal congestion: Secondary | ICD-10-CM | POA: Diagnosis not present

## 2019-08-31 DIAGNOSIS — N649 Disorder of breast, unspecified: Secondary | ICD-10-CM | POA: Diagnosis not present

## 2019-08-31 DIAGNOSIS — N644 Mastodynia: Secondary | ICD-10-CM | POA: Diagnosis not present

## 2019-09-20 DIAGNOSIS — N6001 Solitary cyst of right breast: Secondary | ICD-10-CM | POA: Diagnosis not present

## 2019-09-20 DIAGNOSIS — R928 Other abnormal and inconclusive findings on diagnostic imaging of breast: Secondary | ICD-10-CM | POA: Diagnosis not present

## 2019-09-20 DIAGNOSIS — N6081 Other benign mammary dysplasias of right breast: Secondary | ICD-10-CM | POA: Diagnosis not present

## 2019-09-20 DIAGNOSIS — N644 Mastodynia: Secondary | ICD-10-CM | POA: Diagnosis not present

## 2019-09-20 DIAGNOSIS — N6489 Other specified disorders of breast: Secondary | ICD-10-CM | POA: Diagnosis not present

## 2019-09-21 DIAGNOSIS — Z79899 Other long term (current) drug therapy: Secondary | ICD-10-CM | POA: Diagnosis not present

## 2019-09-21 DIAGNOSIS — J441 Chronic obstructive pulmonary disease with (acute) exacerbation: Secondary | ICD-10-CM | POA: Diagnosis not present

## 2019-09-21 DIAGNOSIS — Z716 Tobacco abuse counseling: Secondary | ICD-10-CM | POA: Diagnosis not present

## 2019-09-21 DIAGNOSIS — Z9181 History of falling: Secondary | ICD-10-CM | POA: Diagnosis not present

## 2019-09-21 DIAGNOSIS — Z1331 Encounter for screening for depression: Secondary | ICD-10-CM | POA: Diagnosis not present

## 2019-09-21 DIAGNOSIS — Z0000 Encounter for general adult medical examination without abnormal findings: Secondary | ICD-10-CM | POA: Diagnosis not present

## 2019-09-21 DIAGNOSIS — J302 Other seasonal allergic rhinitis: Secondary | ICD-10-CM | POA: Diagnosis not present

## 2019-09-25 DIAGNOSIS — N644 Mastodynia: Secondary | ICD-10-CM | POA: Diagnosis not present

## 2019-10-12 DIAGNOSIS — D696 Thrombocytopenia, unspecified: Secondary | ICD-10-CM | POA: Diagnosis not present

## 2019-10-12 DIAGNOSIS — Z79899 Other long term (current) drug therapy: Secondary | ICD-10-CM | POA: Diagnosis not present

## 2019-10-25 DIAGNOSIS — R0600 Dyspnea, unspecified: Secondary | ICD-10-CM | POA: Diagnosis not present

## 2019-10-25 DIAGNOSIS — J449 Chronic obstructive pulmonary disease, unspecified: Secondary | ICD-10-CM | POA: Diagnosis not present

## 2019-10-25 DIAGNOSIS — R011 Cardiac murmur, unspecified: Secondary | ICD-10-CM | POA: Diagnosis not present

## 2019-11-02 ENCOUNTER — Encounter: Payer: Self-pay | Admitting: General Practice

## 2019-11-06 ENCOUNTER — Encounter: Payer: Self-pay | Admitting: General Practice

## 2019-11-09 DIAGNOSIS — D696 Thrombocytopenia, unspecified: Secondary | ICD-10-CM | POA: Diagnosis not present

## 2019-11-16 DIAGNOSIS — J3081 Allergic rhinitis due to animal (cat) (dog) hair and dander: Secondary | ICD-10-CM | POA: Diagnosis not present

## 2019-11-16 DIAGNOSIS — R110 Nausea: Secondary | ICD-10-CM | POA: Diagnosis not present

## 2019-11-16 DIAGNOSIS — N649 Disorder of breast, unspecified: Secondary | ICD-10-CM | POA: Diagnosis not present

## 2020-02-03 DIAGNOSIS — N611 Abscess of the breast and nipple: Secondary | ICD-10-CM | POA: Diagnosis not present

## 2020-02-05 DIAGNOSIS — N61 Mastitis without abscess: Secondary | ICD-10-CM | POA: Diagnosis not present

## 2020-04-02 DIAGNOSIS — Z681 Body mass index (BMI) 19 or less, adult: Secondary | ICD-10-CM | POA: Diagnosis not present

## 2020-04-02 DIAGNOSIS — L700 Acne vulgaris: Secondary | ICD-10-CM | POA: Diagnosis not present

## 2020-04-02 DIAGNOSIS — H547 Unspecified visual loss: Secondary | ICD-10-CM | POA: Diagnosis not present

## 2020-05-03 DIAGNOSIS — R509 Fever, unspecified: Secondary | ICD-10-CM | POA: Diagnosis not present

## 2020-05-03 DIAGNOSIS — R300 Dysuria: Secondary | ICD-10-CM | POA: Diagnosis not present

## 2020-05-03 DIAGNOSIS — R6883 Chills (without fever): Secondary | ICD-10-CM | POA: Diagnosis not present

## 2020-05-21 DIAGNOSIS — L700 Acne vulgaris: Secondary | ICD-10-CM | POA: Diagnosis not present

## 2020-05-21 DIAGNOSIS — L570 Actinic keratosis: Secondary | ICD-10-CM | POA: Diagnosis not present

## 2020-05-21 DIAGNOSIS — L728 Other follicular cysts of the skin and subcutaneous tissue: Secondary | ICD-10-CM | POA: Diagnosis not present

## 2020-05-23 DIAGNOSIS — R634 Abnormal weight loss: Secondary | ICD-10-CM | POA: Diagnosis not present

## 2020-05-23 DIAGNOSIS — Z681 Body mass index (BMI) 19 or less, adult: Secondary | ICD-10-CM | POA: Diagnosis not present

## 2020-05-23 DIAGNOSIS — R0600 Dyspnea, unspecified: Secondary | ICD-10-CM | POA: Diagnosis not present

## 2020-05-28 ENCOUNTER — Other Ambulatory Visit: Payer: Self-pay

## 2020-05-28 DIAGNOSIS — M1990 Unspecified osteoarthritis, unspecified site: Secondary | ICD-10-CM | POA: Insufficient documentation

## 2020-05-28 DIAGNOSIS — R0981 Nasal congestion: Secondary | ICD-10-CM | POA: Insufficient documentation

## 2020-05-28 DIAGNOSIS — F419 Anxiety disorder, unspecified: Secondary | ICD-10-CM | POA: Insufficient documentation

## 2020-05-28 DIAGNOSIS — D649 Anemia, unspecified: Secondary | ICD-10-CM | POA: Insufficient documentation

## 2020-05-28 DIAGNOSIS — E119 Type 2 diabetes mellitus without complications: Secondary | ICD-10-CM | POA: Insufficient documentation

## 2020-05-28 DIAGNOSIS — I509 Heart failure, unspecified: Secondary | ICD-10-CM | POA: Insufficient documentation

## 2020-05-28 DIAGNOSIS — J449 Chronic obstructive pulmonary disease, unspecified: Secondary | ICD-10-CM | POA: Insufficient documentation

## 2020-05-28 DIAGNOSIS — F32A Depression, unspecified: Secondary | ICD-10-CM | POA: Insufficient documentation

## 2020-05-29 ENCOUNTER — Other Ambulatory Visit: Payer: Self-pay

## 2020-05-29 ENCOUNTER — Encounter: Payer: Self-pay | Admitting: Cardiology

## 2020-05-29 ENCOUNTER — Ambulatory Visit (INDEPENDENT_AMBULATORY_CARE_PROVIDER_SITE_OTHER): Payer: Medicare Other

## 2020-05-29 ENCOUNTER — Ambulatory Visit (INDEPENDENT_AMBULATORY_CARE_PROVIDER_SITE_OTHER): Payer: Medicare Other | Admitting: Cardiology

## 2020-05-29 VITALS — BP 110/68 | HR 83 | Ht 69.0 in | Wt 125.8 lb

## 2020-05-29 DIAGNOSIS — R002 Palpitations: Secondary | ICD-10-CM

## 2020-05-29 DIAGNOSIS — R011 Cardiac murmur, unspecified: Secondary | ICD-10-CM | POA: Diagnosis not present

## 2020-05-29 DIAGNOSIS — R0602 Shortness of breath: Secondary | ICD-10-CM | POA: Diagnosis not present

## 2020-05-29 DIAGNOSIS — R9431 Abnormal electrocardiogram [ECG] [EKG]: Secondary | ICD-10-CM

## 2020-05-29 DIAGNOSIS — R55 Syncope and collapse: Secondary | ICD-10-CM

## 2020-05-29 DIAGNOSIS — E782 Mixed hyperlipidemia: Secondary | ICD-10-CM

## 2020-05-29 NOTE — Patient Instructions (Addendum)
Medication Instructions:  No medication changes. *If you need a refill on your cardiac medications before your next appointment, please call your pharmacy*   Lab Work: Your physician recommends that you have labs done in the office today. Your test included  basic metabolic panel, complete blood count, magnesium and BNP.  If you have labs (blood work) drawn today and your tests are completely normal, you will receive your results only by: Marland Kitchen MyChart Message (if you have MyChart) OR . A paper copy in the mail If you have any lab test that is abnormal or we need to change your treatment, we will call you to review the results.   Testing/Procedures: Your physician has requested that you have an echocardiogram. Echocardiography is a painless test that uses sound waves to create images of your heart. It provides your doctor with information about the size and shape of your heart and how well your heart's chambers and valves are working. This procedure takes approximately one hour. There are no restrictions for this procedure.   WHY IS MY DOCTOR PRESCRIBING ZIO? The Zio system is proven and trusted by physicians to detect and diagnose irregular heart rhythms -- and has been prescribed to hundreds of thousands of patients.  The FDA has cleared the Zio system to monitor for many different kinds of irregular heart rhythms. In a study, physicians were able to reach a diagnosis 90% of the time with the Zio system1.  You can wear the Zio monitor -- a small, discreet, comfortable patch -- during your normal day-to-day activity, including while you sleep, shower, and exercise, while it records every single heartbeat for analysis.  1Barrett, P., et al. Comparison of 24 Hour Holter Monitoring Versus 14 Day Novel Adhesive Patch Electrocardiographic Monitoring. [place], 2014.  ZIO VS. HOLTER MONITORING The Zio monitor can be comfortably worn for up to 14 days. Holter monitors can be  worn for 24 to 48 hours, limiting the time to record any irregular heart rhythms you may have. Zio is able to capture data for the 51% of patients who have their first symptom-triggered arrhythmia after 48 hours.1  LIVE WITHOUT RESTRICTIONS The Zio ambulatory cardiac monitor is a small, unobtrusive, and water-resistant patch--you might even forget you're wearing it. The Zio monitor records and stores every beat of your heart, whether you're sleeping, working out, or showering.  Wear the monitor for 3 days.   Follow-Up: At [facility], you and your health needs are our priority.  As part of our continuing mission to provide you with exceptional heart care, we have created designated Provider Care Teams.  These Care Teams include your primary Cardiologist (physician) and Advanced Practice Providers (APPs -  Physician Assistants and Nurse Practitioners) who all work together to provide you with the care you need, when you need it.  We recommend signing up for the patient portal called "MyChart".  Sign up information is provided on this After Visit Summary.  MyChart is used to connect with patients for Virtual Visits (Telemedicine).  Patients are able to view lab/test results, encounter notes, upcoming appointments, etc.  Non-urgent messages can be sent to your provider as well.   To learn more about what you can do with MyChart, go to [email or web address].    Your next appointment:   2 week(s)  The format for your next appointment:   In Person  Provider:   Berniece Salines, DO   Other Instructions  Echocardiogram An echocardiogram is a procedure that uses painless  sound waves (ultrasound) to produce an image of the heart. Images from an echocardiogram can provide important information about:  Signs of coronary artery disease (CAD).  Aneurysm detection. An aneurysm is a weak or damaged part of an artery wall that bulges out from the normal force of blood pumping through the body.  Heart  size and shape. Changes in the size or shape of the heart can be associated with certain conditions, including heart failure, aneurysm, and CAD.  Heart muscle function.  Heart valve function.  Signs of a past heart attack.  Fluid buildup around the heart.  Thickening of the heart muscle.  A tumor or infectious growth around the heart valves. Tell a health care provider about:  Any allergies you have.  All medicines you are taking, including vitamins, herbs, eye drops, creams, and over-the-counter medicines.  Any blood disorders you have.  Any surgeries you have had.  Any medical conditions you have.  Whether you are pregnant or may be pregnant. What are the risks? Generally, this is a safe procedure. However, problems may occur, including:  Allergic reaction to dye (contrast) that may be used during the procedure. What happens before the procedure? No specific preparation is needed. You may eat and drink normally. What happens during the procedure?   An IV tube may be inserted into one of your veins.  You may receive contrast through this tube. A contrast is an injection that improves the quality of the pictures from your heart.  A gel will be applied to your chest.  A wand-like tool (transducer) will be moved over your chest. The gel will help to transmit the sound waves from the transducer.  The sound waves will harmlessly bounce off of your heart to allow the heart images to be captured in real-time motion. The images will be recorded on a computer. The procedure may vary among health care providers and hospitals. What happens after the procedure?  You may return to your normal, everyday life, including diet, activities, and medicines, unless your health care provider tells you not to do that. Summary  An echocardiogram is a procedure that uses painless sound waves (ultrasound) to produce an image of the heart.  Images from an echocardiogram can provide  important information about the size and shape of your heart, heart muscle function, heart valve function, and fluid buildup around your heart.  You do not need to do anything to prepare before this procedure. You may eat and drink normally.  After the echocardiogram is completed, you may return to your normal, everyday life, unless your health care provider tells you not to do that. This information is not intended to replace advice given to you by your health care provider. Make sure you discuss any questions you have with your health care provider. Document Revised: 09/14/2018 Document Reviewed: 06/26/2016 Elsevier Patient Education  2020 [employer].

## 2020-05-29 NOTE — Progress Notes (Signed)
Cardiology Office Note:    Date:  05/29/2020   ID:  [name], DOB 1965/07/31, MRN [id number]  PCP:  Buckner Malta, MD  Cardiologist:  Thomasene Ripple, DO  Electrophysiologist:  None   Referring MD: Buckner Malta, MD   " I am having palpitations but the main thing is I am really tired and has been passing out recently"  History of Present Illness:    [name] is a 54 y.o. female with a hx of hyperlipidemia, hypertension, COPD here today to be evaluated because she has had worsening palpitations as well as syncope episodes.  She tells me that these syncope episodes are abrupt onset and she is doing think she feels as if she is going to pass out and she notes that she does pass does not know how long she is off work because she lives alone.  She also tells me that she has frequent palpitations and they are really concerning for her.  She has not had any chest pain but she does have associated shortness of breath on exertion.  She is getting tired every day.  Past Medical History:  Diagnosis Date  . Anemia   . Anxiety   . Arthritis   . Asthma   . Bacteremia 06/30/2013  . Breast infection 03/21/2012  . CHF (congestive heart failure) (HCC)    hx CHF 1 year ago in [facility]  . Confusion 06/25/2013  . COPD (chronic obstructive pulmonary disease) (HCC)   . COPD exacerbation (HCC) 06/26/2013  . Cough with congestion of paranasal sinus   . Depression   . Diabetes mellitus without complication (HCC)    on meds  . Fibromyalgia 03/31/2012  . GERD (gastroesophageal reflux disease)   . Heart murmur   . Hemophilus influenzae (H. influenzae) infection in conditions classified elsewhere and of unspecified site 06/30/2013  . History of MRSA infection 03/31/2012   Formatting of this note might be different from the original. Rt arm 02/2012 per pt in setting of spider bite  . Hyperlipidemia 06/25/2013  . Hypertension 03/31/2012  . Insomnia 06/25/2013  .  Mastitis chronic 03/06/2012  . Neuropathy 06/25/2013  . Pneumonia 2015  . Preop examination 03/15/2016   Formatting of this note might be different from the original. Added automatically from request for surgery [phone]  . PTSD (post-traumatic stress disorder) 06/25/2013  . Respiratory failure with hypoxia (HCC) 06/26/2013  . Shoulder pain 07/01/2013  . Sinus congestion   . Tobacco abuse 06/25/2013  . Weakness 06/25/2013    Past Surgical History:  Procedure Laterality Date  . ABDOMINAL HYSTERECTOMY  1998   tubal pregnancy right side partial hysterectomy  . APPENDECTOMY    . BREAST SURGERY Bilateral    chronic mastitis  . CATARACT EXTRACTION W/ INTRAOCULAR LENS  IMPLANT, BILATERAL    . MULTIPLE EXTRACTIONS WITH ALVEOLOPLASTY N/A 08/09/2014   Procedure: MULTIPLE EXTRACTIONS WITH ALVEOLOPLASTY ;  Surgeon: Ocie Doyne, DDS;  Location: [facility] OR;  Service: Oral Surgery;  Laterality: N/A;    Current Medications: No outpatient medications have been marked as taking for the 05/29/20 encounter (Office Visit) with Thomasene Ripple, DO.     Allergies:   Clarithromycin, Penicillins, Wound dressing adhesive, Other, Propoxyphene, Sulfamethoxazole, Chocolate, Percocet [oxycodone-acetaminophen], Ranitidine, Sulfa antibiotics, Tape, Coconut fatty acids, Codeine, and Oxycodone-acetaminophen   Social History   Socioeconomic History  . Marital status: Married    Spouse name: Not on file  . Number of children: Not on file  . Years of  education: Not on file  . Highest education level: Not on file  Occupational History  . Not on file  Tobacco Use  . Smoking status: Current Every Day Smoker    Packs/day: 0.25    Years: 32.00    Pack years: 8.00    Types: Cigarettes  . Smokeless tobacco: Never Used  Substance and Sexual Activity  . Alcohol use: No  . Drug use: No  . Sexual activity: Not on file  Other Topics Concern  . Not on file  Social History Narrative  . Not on file   Social Determinants of  Health   Financial Resource Strain: Not on file  Food Insecurity: Not on file  Transportation Needs: Not on file  Physical Activity: Not on file  Stress: Not on file  Social Connections: Not on file     Family History: The patient's family history includes Hypertension in her father and mother; Skin cancer in her maternal grandmother and mother; Stroke in her mother.  ROS:   Review of Systems  Constitution: Negative for decreased appetite, fever and weight gain.  HENT: Negative for congestion, ear discharge, hoarse voice and sore throat.   Eyes: Negative for discharge, redness, vision loss in right eye and visual halos.  Cardiovascular: Reports palpitations, dyspnea on exertion and syncope.  Negative for chest pain and orthopnea Respiratory: Negative for cough, hemoptysis, shortness of breath and snoring.   Endocrine: Negative for heat intolerance and polyphagia.  Hematologic/Lymphatic: Negative for bleeding problem. Does not bruise/bleed easily.  Skin: Negative for flushing, nail changes, rash and suspicious lesions.  Musculoskeletal: Negative for arthritis, joint pain, muscle cramps, myalgias, neck pain and stiffness.  Gastrointestinal: Negative for abdominal pain, bowel incontinence, diarrhea and excessive appetite.  Genitourinary: Negative for decreased libido, genital sores and incomplete emptying.  Neurological: Negative for brief paralysis, focal weakness, headaches and loss of balance.  Psychiatric/Behavioral: Negative for altered mental status, depression and suicidal ideas.  Allergic/Immunologic: Negative for HIV exposure and persistent infections.    EKGs/Labs/Other Studies Reviewed:    The following studies were reviewed today:   EKG:  The ekg ordered today demonstrates sinus rhythm, heart rate 83 bpm with hyperacute diffuse peaked T waves left and right atrial enlargement.  No prior EKG for comparison.  Recent Labs: No results found for requested labs within last  8760 hours.  Recent Lipid Panel No results found for: CHOL, TRIG, HDL, CHOLHDL, VLDL, LDLCALC, LDLDIRECT  Physical Exam:    VS:  BP 110/68 (BP Location: Right Arm)   Pulse 83   Ht 5\' 9"  (1.753 m)   Wt 125 lb 12.8 oz (57.1 kg)   SpO2 95%   BMI 18.58 kg/m     Wt Readings from Last 3 Encounters:  05/29/20 125 lb 12.8 oz (57.1 kg)  08/09/14 144 lb (65.3 kg)  08/05/14 144 lb 6.4 oz (65.5 kg)     GEN: Well nourished, well developed in no acute distress HEENT: Normal NECK: No JVD; No carotid bruits LYMPHATICS: No lymphadenopathy CARDIAC: S1S2 noted,RRR, 4/6 midsystolic ejection diffuse precordial murmur radiating to the neck and back, rubs, gallops RESPIRATORY:  Clear to auscultation without rales, wheezing or rhonchi  ABDOMEN: Soft, non-tender, non-distended, +bowel sounds, no guarding. EXTREMITIES: No edema, No cyanosis, no clubbing MUSCULOSKELETAL:  No deformity  SKIN: Warm and dry NEUROLOGIC:  Alert and oriented x 3, non-focal PSYCHIATRIC:  Normal affect, good insight  ASSESSMENT:    1. Syncope and collapse   2. Abnormal EKG   3. Palpitations  4. Shortness of breath   5. Murmur, cardiac   6. Mixed hyperlipidemia    PLAN:    The characteristics of her murmur is suggesting aortic stenosis.  What I like to do is get an echocardiogram in this patient to assess this possible valvular abnormality.  I am concerned that her syncope may be related to this as well.  In the meantime since she also has palpitations I will place a monitor the patient for 3 days.  She has risk factors in the light of her shortness of breath and may certainly ischemic evaluation but first I would like to understand her valvular pathology and then proceed with the appropriate ischemic work-up for this patient.  She is a smoker and she is trying to quit smoking she tells me.  She does have a history of hyperlipidemia she stopped all of her medications a couple weeks back.  Spoke with the patient that we  would probably need to restart her statin medication.  She will think about this. Her blood pressure is acceptable at this time no changes needs to be made.  I have educated the patient about the findings and why we are need this testing and she is agreeable to proceed with all of the testing. She is in office today with her aide Anderson Malta. We will get blood work which will include BMP, mag as well as BNP to understand any subclinical heart failure at this time.  The patient is in agreement with the above plan. The patient left the office in stable condition.  The patient will follow up in 2 weeks or sooner if needed.   Medication Adjustments/Labs and Tests Ordered: Current medicines are reviewed at length with the patient today.  Concerns regarding medicines are outlined above.  Orders Placed This Encounter  Procedures  . Basic metabolic panel  . Brain natriuretic peptide  . Magnesium  . CBC with Differential/Platelet  . LONG TERM MONITOR (3-14 DAYS)  . EKG 12-Lead  . ECHOCARDIOGRAM COMPLETE   No orders of the defined types were placed in this encounter.   There are no Patient Instructions on file for this visit.   Adopting a Healthy Lifestyle.  Know what a healthy weight is for you (roughly BMI <25) and aim to maintain this   Aim for 7+ servings of fruits and vegetables daily   65-80+ fluid ounces of water or unsweet tea for healthy kidneys   Limit to max 1 drink of alcohol per day; avoid smoking/tobacco   Limit animal fats in diet for cholesterol and heart health - choose grass fed whenever available   Avoid highly processed foods, and foods high in saturated/trans fats   Aim for low stress - take time to unwind and care for your mental health   Aim for 150 min of moderate intensity exercise weekly for heart health, and weights twice weekly for bone health   Aim for 7-9 hours of sleep daily   When it comes to diets, agreement about the perfect plan isnt easy to find,  even among the experts. Experts at the [employer] developed an idea known as the Healthy Eating Plate. Just imagine a plate divided into logical, healthy portions.   The emphasis is on diet quality:   Load up on vegetables and fruits - one-half of your plate: Aim for color and variety, and remember that potatoes dont count.   Go for whole grains - one-quarter of your plate: Whole wheat, barley, wheat  berries, quinoa, oats, brown rice, and foods made with them. If you want pasta, go with whole wheat pasta.   Protein power - one-quarter of your plate: Fish, chicken, beans, and nuts are all healthy, versatile protein sources. Limit red meat.   The diet, however, does go beyond the plate, offering a few other suggestions.   Use healthy plant oils, such as olive, canola, soy, corn, sunflower and peanut. Check the labels, and avoid partially hydrogenated oil, which have unhealthy trans fats.   If youre thirsty, drink water. Coffee and tea are good in moderation, but skip sugary drinks and limit milk and dairy products to one or two daily servings.   The type of carbohydrate in the diet is more important than the amount. Some sources of carbohydrates, such as vegetables, fruits, whole grains, and beans-are healthier than others.   Finally, stay active  Signed, Berniece Salines, DO  05/29/2020 2:59 PM    [place]

## 2020-05-31 LAB — BASIC METABOLIC PANEL
BUN/Creatinine Ratio: 19 (ref 9–23)
BUN: 15 mg/dL (ref 6–24)
CO2: 24 mmol/L (ref 20–29)
Calcium: 8.6 mg/dL — ABNORMAL LOW (ref 8.7–10.2)
Chloride: 108 mmol/L — ABNORMAL HIGH (ref 96–106)
Creatinine, Ser: 0.77 mg/dL (ref 0.57–1.00)
GFR calc Af Amer: 101 mL/min/{1.73_m2} (ref >59)
GFR calc non Af Amer: 88 mL/min/{1.73_m2} (ref >59)
Glucose: 93 mg/dL (ref 65–99)
Potassium: 4.2 mmol/L (ref 3.5–5.2)
Sodium: 145 mmol/L — ABNORMAL HIGH (ref 134–144)

## 2020-05-31 LAB — CBC WITH DIFFERENTIAL/PLATELET
Basophils Absolute: 0.1 10*3/uL (ref 0.0–0.2)
Basos: 3 %
EOS (ABSOLUTE): 0 10*3/uL (ref 0.0–0.4)
Eos: 1 %
Hematocrit: 42.2 % (ref 34.0–46.6)
Hemoglobin: 13.6 g/dL (ref 11.1–15.9)
Immature Grans (Abs): 0 10*3/uL (ref 0.0–0.1)
Immature Granulocytes: 1 %
Lymphocytes Absolute: 0.7 10*3/uL (ref 0.7–3.1)
Lymphs: 34 %
MCH: 27.6 pg (ref 26.6–33.0)
MCHC: 32.2 g/dL (ref 31.5–35.7)
MCV: 86 fL (ref 79–97)
Monocytes Absolute: 0.3 10*3/uL (ref 0.1–0.9)
Monocytes: 16 %
Neutrophils Absolute: 0.9 10*3/uL — ABNORMAL LOW (ref 1.4–7.0)
Neutrophils: 45 %
Platelets: 93 10*3/uL — CL (ref 150–450)
RBC: 4.93 x10E6/uL (ref 3.77–5.28)
RDW: 14.8 % (ref 11.7–15.4)
WBC: 1.9 10*3/uL — CL (ref 3.4–10.8)

## 2020-05-31 LAB — BRAIN NATRIURETIC PEPTIDE: BNP: 150.7 pg/mL — ABNORMAL HIGH (ref 0.0–100.0)

## 2020-05-31 LAB — MAGNESIUM: Magnesium: 2.2 mg/dL (ref 1.6–2.3)

## 2020-06-01 DIAGNOSIS — R0602 Shortness of breath: Secondary | ICD-10-CM

## 2020-06-01 DIAGNOSIS — R002 Palpitations: Secondary | ICD-10-CM | POA: Diagnosis not present

## 2020-06-02 ENCOUNTER — Telehealth: Payer: Self-pay

## 2020-06-02 NOTE — Telephone Encounter (Signed)
-----   Message from Berniece Salines, DO sent at 06/02/2020  9:11 AM EST ----- Your sodium is slightly elevated, your platelet is very low, I do see that has been low in the past that you had follow-ups with the blood doctor.  Were going to forward this to your PCP and will need to be repeated soon by her PCP.  Please follow the patient blood work t to the patient's PCP.

## 2020-06-02 NOTE — Telephone Encounter (Signed)
Tried calling patient. No answer and no voicemail set up for me to leave a message. 

## 2020-06-03 ENCOUNTER — Telehealth: Payer: Self-pay

## 2020-06-03 NOTE — Telephone Encounter (Signed)
Spoke with patient regarding results and recommendation.  Patient verbalizes understanding and is agreeable to plan of care. Advised patient to call back with any issues or concerns.  

## 2020-06-03 NOTE — Telephone Encounter (Signed)
-----   Message from Thomasene Ripple, DO sent at 06/02/2020  9:11 AM EST ----- Your sodium is slightly elevated, your platelet is very low, I do see that has been low in the past that you had follow-ups with the blood doctor.  Were going to forward this to your PCP and will need to be repeated soon by her PCP.  Please follow the patient blood work t to the patient's PCP.

## 2020-06-04 ENCOUNTER — Other Ambulatory Visit: Payer: Self-pay

## 2020-06-04 ENCOUNTER — Ambulatory Visit (HOSPITAL_COMMUNITY)
Admission: RE | Admit: 2020-06-04 | Discharge: 2020-06-04 | Disposition: A | Discharge status: Home / Self Care | Payer: Medicare Other | Source: Ambulatory Visit | Attending: Cardiology | Admitting: Cardiology

## 2020-06-04 DIAGNOSIS — J449 Chronic obstructive pulmonary disease, unspecified: Secondary | ICD-10-CM | POA: Diagnosis not present

## 2020-06-04 DIAGNOSIS — E785 Hyperlipidemia, unspecified: Secondary | ICD-10-CM | POA: Insufficient documentation

## 2020-06-04 DIAGNOSIS — R0602 Shortness of breath: Secondary | ICD-10-CM

## 2020-06-04 DIAGNOSIS — I509 Heart failure, unspecified: Secondary | ICD-10-CM | POA: Insufficient documentation

## 2020-06-04 DIAGNOSIS — R011 Cardiac murmur, unspecified: Secondary | ICD-10-CM | POA: Diagnosis not present

## 2020-06-04 DIAGNOSIS — R002 Palpitations: Secondary | ICD-10-CM | POA: Diagnosis not present

## 2020-06-04 DIAGNOSIS — I110 Hypertensive heart disease with heart failure: Secondary | ICD-10-CM | POA: Insufficient documentation

## 2020-06-04 NOTE — Progress Notes (Signed)
  Echocardiogram 2D Echocardiogram has been performed.  Stark Bray Swaim 06/04/2020, 3:05 PM

## 2020-06-09 DIAGNOSIS — Z681 Body mass index (BMI) 19 or less, adult: Secondary | ICD-10-CM | POA: Diagnosis not present

## 2020-06-09 DIAGNOSIS — R0982 Postnasal drip: Secondary | ICD-10-CM | POA: Diagnosis not present

## 2020-06-09 DIAGNOSIS — J309 Allergic rhinitis, unspecified: Secondary | ICD-10-CM | POA: Diagnosis not present

## 2020-06-09 DIAGNOSIS — D693 Immune thrombocytopenic purpura: Secondary | ICD-10-CM | POA: Diagnosis not present

## 2020-06-10 DIAGNOSIS — R002 Palpitations: Secondary | ICD-10-CM | POA: Diagnosis not present

## 2020-06-10 DIAGNOSIS — R0602 Shortness of breath: Secondary | ICD-10-CM | POA: Diagnosis not present

## 2020-06-12 DIAGNOSIS — F419 Anxiety disorder, unspecified: Secondary | ICD-10-CM | POA: Insufficient documentation

## 2020-06-12 LAB — ECHOCARDIOGRAM COMPLETE
AR max vel: 1.34 cm2
AV Area VTI: 1.29 cm2
AV Area mean vel: 1.37 cm2
AV Mean grad: 30 mmHg
AV Peak grad: 53.6 mmHg
Ao pk vel: 3.66 m/s
Area-P 1/2: 2.96 cm2
Calc EF: 61.1 %
S' Lateral: 2.6 cm
Single Plane A2C EF: 62.2 %
Single Plane A4C EF: 60.3 %

## 2020-06-16 ENCOUNTER — Ambulatory Visit: Payer: Medicare Other | Admitting: Cardiology

## 2020-06-17 ENCOUNTER — Ambulatory Visit: Payer: Medicare Other | Admitting: Cardiology

## 2020-06-18 DIAGNOSIS — H35363 Drusen (degenerative) of macula, bilateral: Secondary | ICD-10-CM | POA: Diagnosis not present

## 2020-06-19 ENCOUNTER — Telehealth: Payer: Self-pay

## 2020-06-19 NOTE — Telephone Encounter (Signed)
Patient to notified and scheduled.

## 2020-06-19 NOTE — Telephone Encounter (Signed)
-----   Message from Berniece Salines, DO sent at 06/19/2020  1:12 PM EST ----- I would like to see the patient to discuss her echo report.

## 2020-06-23 ENCOUNTER — Other Ambulatory Visit: Payer: Self-pay

## 2020-06-24 ENCOUNTER — Ambulatory Visit: Payer: Medicare Other | Admitting: Cardiology

## 2020-07-03 DIAGNOSIS — R21 Rash and other nonspecific skin eruption: Secondary | ICD-10-CM | POA: Diagnosis not present

## 2020-07-03 DIAGNOSIS — D692 Other nonthrombocytopenic purpura: Secondary | ICD-10-CM | POA: Diagnosis not present

## 2020-07-08 DIAGNOSIS — R197 Diarrhea, unspecified: Secondary | ICD-10-CM | POA: Diagnosis not present

## 2020-07-08 DIAGNOSIS — Z7689 Persons encountering health services in other specified circumstances: Secondary | ICD-10-CM | POA: Diagnosis not present

## 2020-07-08 DIAGNOSIS — K5900 Constipation, unspecified: Secondary | ICD-10-CM | POA: Diagnosis not present

## 2020-07-08 DIAGNOSIS — S4992XA Unspecified injury of left shoulder and upper arm, initial encounter: Secondary | ICD-10-CM | POA: Diagnosis not present

## 2020-07-08 DIAGNOSIS — D691 Qualitative platelet defects: Secondary | ICD-10-CM | POA: Diagnosis not present

## 2020-07-08 DIAGNOSIS — Z681 Body mass index (BMI) 19 or less, adult: Secondary | ICD-10-CM | POA: Diagnosis not present

## 2020-07-08 DIAGNOSIS — E2749 Other adrenocortical insufficiency: Secondary | ICD-10-CM | POA: Diagnosis not present

## 2020-07-08 DIAGNOSIS — R34 Anuria and oliguria: Secondary | ICD-10-CM | POA: Diagnosis not present

## 2020-07-09 ENCOUNTER — Other Ambulatory Visit: Payer: Self-pay

## 2020-07-09 ENCOUNTER — Encounter: Payer: Self-pay | Admitting: Cardiology

## 2020-07-09 ENCOUNTER — Ambulatory Visit (INDEPENDENT_AMBULATORY_CARE_PROVIDER_SITE_OTHER): Payer: Medicare Other | Admitting: Cardiology

## 2020-07-09 VITALS — BP 110/66 | HR 80 | Ht 69.0 in | Wt 123.8 lb

## 2020-07-09 DIAGNOSIS — R079 Chest pain, unspecified: Secondary | ICD-10-CM

## 2020-07-09 DIAGNOSIS — I350 Nonrheumatic aortic (valve) stenosis: Secondary | ICD-10-CM

## 2020-07-09 DIAGNOSIS — Z720 Tobacco use: Secondary | ICD-10-CM | POA: Diagnosis not present

## 2020-07-09 DIAGNOSIS — I471 Supraventricular tachycardia: Secondary | ICD-10-CM | POA: Diagnosis not present

## 2020-07-09 MED ORDER — DILTIAZEM HCL ER COATED BEADS 120 MG PO CP24: 120.0000 mg | ORAL_CAPSULE | Freq: Every day | ORAL | 3 refills | Status: DC

## 2020-07-09 NOTE — Patient Instructions (Addendum)
Medication Instructions:  Your physician has recommended you make the following change in your medication:   START: Cardizem 120 mg @ bedtime *If you need a refill on your cardiac medications before your next appointment, please call your pharmacy*   Lab Work: Your physician recommends that you return for lab work in:  [facility], [employer]   If you have labs (blood work) drawn today and your tests are completely normal, you will receive your results only by: Marland Kitchen MyChart Message (if you have MyChart) OR . A paper copy in the mail If you have any lab test that is abnormal or we need to change your treatment, we will call you to review the results.   Testing/Procedures: Your cardiac CT will be scheduled at one of the below locations:   [facility] [address] [place], [place] [address] [phone]  If scheduled at [facility], please arrive at the [facility] main entrance of [facility] 30 minutes prior to test start time. Proceed to the [facility] Radiology Department (first floor) to check-in and test prep.  Please follow these instructions carefully (unless otherwise directed):  On the Night Before the Test: . Be sure to Drink plenty of water. . Do not consume any caffeinated/decaffeinated beverages or chocolate 12 hours prior to your test. . Do not take any antihistamines 12 hours prior to your test.  On the Day of the Test: . Drink plenty of water. Do not drink any water within one hour of the test. . Do not eat any food 4 hours prior to the test. . You may take your regular medications prior to the test.  . FEMALES- please wear underwire-free bra if available       After the Test: . Drink plenty of water. . After receiving IV contrast, you may experience a mild flushed feeling. This is normal. . On occasion, you may experience a mild rash up to 24 hours after the test. This is not dangerous. If this occurs, you can take Benadryl 25 mg and  increase your fluid intake. . If you experience trouble breathing, this can be serious. If it is severe call 911 IMMEDIATELY. If it is mild, please call our office.    Once we have confirmed authorization from your insurance company, we will call you to set up a date and time for your test. Based on how quickly your insurance processes prior authorizations requests, please allow up to 4 weeks to be contacted for scheduling your Cardiac CT appointment. Be advised that routine Cardiac CT appointments could be scheduled as many as 8 weeks after your provider has ordered it.  For non-scheduling related questions, please contact the cardiac imaging nurse navigator should you have any questions/concerns: Marchia Bond, Cardiac Imaging Nurse Navigator Burley Saver, Interim Cardiac Imaging Nurse [address] and [facility] Services Direct Office Dial: [phone]   For scheduling needs, including cancellations and rescheduling, please call [place], [phone].     Follow-Up: At [facility], you and your health needs are our priority.  As part of our continuing mission to provide you with exceptional heart care, we have created designated Provider Care Teams.  These Care Teams include your primary Cardiologist (physician) and Advanced Practice Providers (APPs -  Physician Assistants and Nurse Practitioners) who all work together to provide you with the care you need, when you need it.  We recommend signing up for the patient portal called "MyChart".  Sign up information is provided on  this After Visit Summary.  MyChart is used to connect with patients for Virtual Visits (Telemedicine).  Patients are able to view lab/test results, encounter notes, upcoming appointments, etc.  Non-urgent messages can be sent to your provider as well.   To learn more about what you can do with MyChart, go to [email or web address].    Your next appointment:   3 month(s)  The format for your next  appointment:   In Person  Provider:   Berniece Salines, DO   Other Instructions

## 2020-07-10 LAB — BASIC METABOLIC PANEL
BUN/Creatinine Ratio: 21 (ref 9–23)
BUN: 15 mg/dL (ref 6–24)
CO2: 23 mmol/L (ref 20–29)
Calcium: 8.8 mg/dL (ref 8.7–10.2)
Chloride: 107 mmol/L — ABNORMAL HIGH (ref 96–106)
Creatinine, Ser: 0.72 mg/dL (ref 0.57–1.00)
GFR calc Af Amer: 110 mL/min/{1.73_m2} (ref >59)
GFR calc non Af Amer: 95 mL/min/{1.73_m2} (ref >59)
Glucose: 95 mg/dL (ref 65–99)
Potassium: 4.5 mmol/L (ref 3.5–5.2)
Sodium: 146 mmol/L — ABNORMAL HIGH (ref 134–144)

## 2020-07-10 LAB — MAGNESIUM: Magnesium: 2.2 mg/dL (ref 1.6–2.3)

## 2020-07-11 NOTE — Progress Notes (Signed)
Cardiology Office Note:    Date:  07/11/2020   ID:  [name], DOB 08-27-1965, MRN [id number]  PCP:  Serita Grammes, MD  Cardiologist:  Berniece Salines, DO  Electrophysiologist:  None   Referring MD: Serita Grammes, MD   " I am having some chest pain"  History of Present Illness:    [name] is a 55 y.o. female with a hx of moderate aortic stenosis on her recent echocardiogram,  smoker,hyperlipidemia, hypertension, COPD here today for a follow up visit.   I last saw the patient on May 29, 2020 at that time she presented because she was having significant palpitation and had a syncope episode.  On physical exam she had a murmur therefore ZIO monitor and echocardiogram was ordered. In the interim the patient had an echocardiogram which showed moderate aortic stenosis.  She also had resume monitor which showed paroxysmal atrial tachycardia.  Today the patient tells me that she has been experiencing intermittent chest pain shortness of breath.  She described as a midsternal chest pain which last for few seconds prior to resolution.  Nothing makes it better or worse.  In addition she notes that the palpitation is still there.   Past Medical History:  Diagnosis Date  . Anemia   . Anxiety   . Arthritis   . Asthma   . Bacteremia 06/30/2013  . Breast infection 03/21/2012  . CHF (congestive heart failure) ([place])    hx CHF 1 year ago in [facility]  . Confusion 06/25/2013  . COPD (chronic obstructive pulmonary disease) ([place])   . COPD exacerbation ([place]) 06/26/2013  . Cough with congestion of paranasal sinus   . Depression   . Diabetes mellitus without complication ([place])    on meds  . Fibromyalgia 03/31/2012  . GERD (gastroesophageal reflux disease)   . Heart murmur   . Hemophilus influenzae (H. influenzae) infection in conditions classified elsewhere and of unspecified site 06/30/2013  . History of MRSA infection 03/31/2012   Formatting of  this note might be different from the original. Rt arm 02/2012 per pt in setting of spider bite  . Hyperlipidemia 06/25/2013  . Hypertension 03/31/2012  . Insomnia 06/25/2013  . Mastitis chronic 03/06/2012  . Neuropathy 06/25/2013  . Pneumonia 2015  . Preop examination 03/15/2016   Formatting of this note might be different from the original. Added automatically from request for surgery [phone]  . PTSD (post-traumatic stress disorder) 06/25/2013  . Respiratory failure with hypoxia ([place]) 06/26/2013  . Shoulder pain 07/01/2013  . Sinus congestion   . Tobacco abuse 06/25/2013  . Weakness 06/25/2013    Past Surgical History:  Procedure Laterality Date  . ABDOMINAL HYSTERECTOMY  1998   tubal pregnancy right side partial hysterectomy  . APPENDECTOMY    . BREAST SURGERY Bilateral    chronic mastitis  . CATARACT EXTRACTION W/ INTRAOCULAR LENS  IMPLANT, BILATERAL    . MULTIPLE EXTRACTIONS WITH ALVEOLOPLASTY N/A 08/09/2014   Procedure: MULTIPLE EXTRACTIONS WITH ALVEOLOPLASTY ;  Surgeon: Diona Browner, DDS;  Location: [facility];  Service: Oral Surgery;  Laterality: N/A;    Current Medications: Current Meds  Medication Sig  . clindamycin (CLEOCIN) 300 MG capsule Take 300 mg by mouth 3 (three) times daily.  Marland Kitchen diltiazem (CARDIZEM CD) 120 MG 24 hr capsule Take 1 capsule (120 mg total) by mouth daily.     Allergies:   Clarithromycin, Penicillins, Wound dressing adhesive, Other, Propoxyphene, Sulfamethoxazole, Chocolate, Percocet [oxycodone-acetaminophen], Ranitidine, Sulfa antibiotics, Tape, Coconut  fatty acids, Codeine, and Oxycodone-acetaminophen   Social History   Socioeconomic History  . Marital status: Married    Spouse name: Not on file  . Number of children: Not on file  . Years of education: Not on file  . Highest education level: Not on file  Occupational History  . Not on file  Tobacco Use  . Smoking status: Current Every Day Smoker    Packs/day: 0.25    Years: 32.00    Pack years: 8.00     Types: Cigarettes  . Smokeless tobacco: Never Used  Substance and Sexual Activity  . Alcohol use: No  . Drug use: No  . Sexual activity: Not on file  Other Topics Concern  . Not on file  Social History Narrative  . Not on file   Social Determinants of Health   Financial Resource Strain: Not on file  Food Insecurity: Not on file  Transportation Needs: Not on file  Physical Activity: Not on file  Stress: Not on file  Social Connections: Not on file     Family History: The patient's family history includes Hypertension in her father and mother; Skin cancer in her maternal grandmother and mother; Stroke in her mother.  ROS:   Review of Systems  Constitution: Negative for decreased appetite, fever and weight gain.  HENT: Negative for congestion, ear discharge, hoarse voice and sore throat.   Eyes: Negative for discharge, redness, vision loss in right eye and visual halos.  Cardiovascular: Negative for chest pain, dyspnea on exertion, leg swelling, orthopnea and palpitations.  Respiratory: Negative for cough, hemoptysis, shortness of breath and snoring.   Endocrine: Negative for heat intolerance and polyphagia.  Hematologic/Lymphatic: Negative for bleeding problem. Does not bruise/bleed easily.  Skin: Negative for flushing, nail changes, rash and suspicious lesions.  Musculoskeletal: Negative for arthritis, joint pain, muscle cramps, myalgias, neck pain and stiffness.  Gastrointestinal: Negative for abdominal pain, bowel incontinence, diarrhea and excessive appetite.  Genitourinary: Negative for decreased libido, genital sores and incomplete emptying.  Neurological: Negative for brief paralysis, focal weakness, headaches and loss of balance.  Psychiatric/Behavioral: Negative for altered mental status, depression and suicidal ideas.  Allergic/Immunologic: Negative for HIV exposure and persistent infections.    EKGs/Labs/Other Studies Reviewed:    The following studies were  reviewed today:   EKG:  None today  Zio Monitor  The patient wore the monitor for 3 days starting May 29, 2020. Indication: Palpitations  The minimum heart rate was 48 bpm, maximum heart rate was 200 bpm, and average heart rate was 71 bpm. Predominant underlying rhythm was Sinus Rhythm.  8 Supraventricular Tachycardia runs occurred, the run with the fastest interval lasting 5 beats with a maximum rate of 200 bpm, the longest lasting 7 beats with an average rate of 137 bpm.  Premature atrial complexes were rare less than 1%. Premature Ventricular complexes were rare less than 1%.  No ventricular tachycardia, no pauses, No AV block and no atrial fibrillation present.  11 patient triggered events most of which is associated with sinus rhythm.  Conclusion: This study is remarkable for supraventricular tachycardia which is likely paroxysmal atrial tachycardia with variable block.   TTE Impression  1. Left ventricular ejection fraction, by estimation, is 60 to 65%. The  left ventricle has normal function. The left ventricle has no regional  wall motion abnormalities. Left ventricular diastolic parameters are  consistent with Grade I diastolic  dysfunction (impaired relaxation).  2. Right ventricular systolic function is normal. The right ventricular  size is normal. There is normal pulmonary artery systolic pressure.  3. The mitral valve is normal in structure. No evidence of mitral valve  regurgitation. No evidence of mitral stenosis.  4. The aortic valve is calcified. There is moderate calcification of the  aortic valve. There is moderate thickening of the aortic valve. Aortic  valve regurgitation is mild. Moderate aortic valve stenosis. Aortic valve  area, by VTI measures 1.29 cm.  Aortic valve mean gradient measures 30.0 mmHg. Aortic valve Vmax measures  3.66 m/s.  5. The inferior vena cava is normal in size with greater than 50%  respiratory variability,  suggesting right atrial pressure of 3 mmHg.   Conclusion(s)/Recommendation(s): Moderate aortic stenosis.   FINDINGS  Left Ventricle: Left ventricular ejection fraction, by estimation, is 60  to 65%. The left ventricle has normal function. The left ventricle has no  regional wall motion abnormalities. The left ventricular internal cavity  size was normal in size. There is  no left ventricular hypertrophy. Left ventricular diastolic parameters  are consistent with Grade I diastolic dysfunction (impaired relaxation).   Right Ventricle: The right ventricular size is normal. No increase in  right ventricular wall thickness. Right ventricular systolic function is  normal. There is normal pulmonary artery systolic pressure. The tricuspid  regurgitant velocity is 2.35 m/s, and  with an assumed right atrial pressure of 3 mmHg, the estimated right  ventricular systolic pressure is 45.8 mmHg.   Left Atrium: Left atrial size was normal in size.   Right Atrium: Right atrial size was normal in size.   Pericardium: There is no evidence of pericardial effusion.   Mitral Valve: The mitral valve is normal in structure. No evidence of  mitral valve regurgitation. No evidence of mitral valve stenosis.   Tricuspid Valve: The tricuspid valve is normal in structure. Tricuspid  valve regurgitation is not demonstrated. No evidence of tricuspid  stenosis.   Aortic Valve: The aortic valve is calcified. There is moderate  calcification of the aortic valve. There is moderate thickening of the  aortic valve. Aortic valve regurgitation is mild. Moderate aortic stenosis  is present. Aortic valve mean gradient  measures 30.0 mmHg. Aortic valve peak gradient measures 53.6 mmHg. Aortic  valve area, by VTI measures 1.29 cm.   Pulmonic Valve: The pulmonic valve was normal in structure. Pulmonic valve  regurgitation is not visualized. No evidence of pulmonic stenosis.   Aorta: The aortic root is normal in  size and structure.   Venous: The inferior vena cava is normal in size with greater than 50%  respiratory variability, suggesting right atrial pressure of 3 mmHg.   IAS/Shunts: No atrial level shunt detected by color flow Doppler.   Recent Labs: 05/29/2020: BNP 150.7; Hemoglobin 13.6; Platelets 93 07/09/2020: BUN 15; Creatinine, Ser 0.72; Magnesium 2.2; Potassium 4.5; Sodium 146  Recent Lipid Panel No results found for: CHOL, TRIG, HDL, CHOLHDL, VLDL, LDLCALC, LDLDIRECT  Physical Exam:    VS:  BP 110/66   Pulse 80   Ht '5\' 9"'  (1.753 m)   Wt 123 lb 12.8 oz (56.2 kg)   SpO2 96%   BMI 18.28 kg/m     Wt Readings from Last 3 Encounters:  07/09/20 123 lb 12.8 oz (56.2 kg)  05/29/20 125 lb 12.8 oz (57.1 kg)  08/09/14 144 lb (65.3 kg)     GEN: Well nourished, well developed in no acute distress HEENT: Normal NECK: No JVD; No carotid bruits LYMPHATICS: No lymphadenopathy CARDIAC: S1S2 noted,RRR, no murmurs, rubs,  gallops RESPIRATORY:  Clear to auscultation without rales, wheezing or rhonchi  ABDOMEN: Soft, non-tender, non-distended, +bowel sounds, no guarding. EXTREMITIES: No edema, No cyanosis, no clubbing MUSCULOSKELETAL:  No deformity  SKIN: Warm and dry NEUROLOGIC:  Alert and oriented x 3, non-focal PSYCHIATRIC:  Normal affect, good insight  ASSESSMENT:    1. Chest pain of uncertain etiology   2. Tobacco abuse   3. Moderate aortic stenosis   4. Chest pain, unspecified type   5. PAT (paroxysmal atrial tachycardia) (HCC)    PLAN:    Her chest pain is concerning patient does have intermediate risk for coronary artery disease would not like to do is pursue an ischemic evaluation in this patient.  Shared decision a coronary CTA at this time is appropriate.  I have discussed with the patient about the testing.  The patient has no IV contrast allergy and is agreeable to proceed with this test.  The patient was counseled on tobacco cessation today for 5 minutes.  Counseling  included reviewing the risks of smoking tobacco products, how it impacts the patient's current medical diagnoses and different strategies for quitting.  Pharmacotherapy to aid in tobacco cessation was not prescribed today. The patient coordinate with  primary care provider.  The patient was also advised to call   1-800-QUIT-NOW 646-155-5913) for additional help with quitting smoking.  I did educate the patient again about her echocardiogram showing evidence of moderate aortic stenosis.  I explained what this meant she expresses understanding.  We will start the patient on Cardizem 120 mg for her paroxysmal atrial tachycardia.  The patient is in agreement with the above plan. The patient left the office in stable condition.  The patient will follow up in 3 months.    Medication Adjustments/Labs and Tests Ordered: Current medicines are reviewed at length with the patient today.  Concerns regarding medicines are outlined above.  Orders Placed This Encounter  Procedures  . CT CORONARY MORPH W/CTA COR W/SCORE W/CA W/CM &/OR WO/CM  . CT CORONARY FRACTIONAL FLOW RESERVE DATA PREP  . CT CORONARY FRACTIONAL FLOW RESERVE FLUID ANALYSIS  . Basic metabolic panel  . Magnesium   Meds ordered this encounter  Medications  . diltiazem (CARDIZEM CD) 120 MG 24 hr capsule    Sig: Take 1 capsule (120 mg total) by mouth daily.    Dispense:  90 capsule    Refill:  3    Patient Instructions  Medication Instructions:  Your physician has recommended you make the following change in your medication:   START: Cardizem 120 mg @ bedtime *If you need a refill on your cardiac medications before your next appointment, please call your pharmacy*   Lab Work: Your physician recommends that you return for lab work in:  [facility], [employer]   If you have labs (blood work) drawn today and your tests are completely normal, you will receive your results only by: Marland Kitchen MyChart Message (if you have MyChart) OR . A paper  copy in the mail If you have any lab test that is abnormal or we need to change your treatment, we will call you to review the results.   Testing/Procedures: Your cardiac CT will be scheduled at one of the below locations:   [facility] [address] [place], [place] [address] [id number]  If scheduled at [facility], please arrive at the [facility] main entrance of [facility] 30 minutes prior to test start time. Proceed to the [facility] Radiology Department (  first floor) to check-in and test prep.  Please follow these instructions carefully (unless otherwise directed):  On the Night Before the Test: . Be sure to Drink plenty of water. . Do not consume any caffeinated/decaffeinated beverages or chocolate 12 hours prior to your test. . Do not take any antihistamines 12 hours prior to your test.  On the Day of the Test: . Drink plenty of water. Do not drink any water within one hour of the test. . Do not eat any food 4 hours prior to the test. . You may take your regular medications prior to the test.  . FEMALES- please wear underwire-free bra if available       After the Test: . Drink plenty of water. . After receiving IV contrast, you may experience a mild flushed feeling. This is normal. . On occasion, you may experience a mild rash up to 24 hours after the test. This is not dangerous. If this occurs, you can take Benadryl 25 mg and increase your fluid intake. . If you experience trouble breathing, this can be serious. If it is severe call 911 IMMEDIATELY. If it is mild, please call our office.    Once we have confirmed authorization from your insurance company, we will call you to set up a date and time for your test. Based on how quickly your insurance processes prior authorizations requests, please allow up to 4 weeks to be contacted for scheduling your Cardiac CT appointment. Be advised that routine Cardiac CT appointments could be  scheduled as many as 8 weeks after your provider has ordered it.  For non-scheduling related questions, please contact the cardiac imaging nurse navigator should you have any questions/concerns: Marchia Bond, Cardiac Imaging Nurse Navigator Burley Saver, Interim Cardiac Imaging Nurse [place] [facility] Direct Office Dial: [phone]   For scheduling needs, including cancellations and rescheduling, please call [place], [phone].     Follow-Up: At [facility], you and your health needs are our priority.  As part of our continuing mission to provide you with exceptional heart care, we have created designated Provider Care Teams.  These Care Teams include your primary Cardiologist (physician) and Advanced Practice Providers (APPs -  Physician Assistants and Nurse Practitioners) who all work together to provide you with the care you need, when you need it.  We recommend signing up for the patient portal called "MyChart".  Sign up information is provided on this After Visit Summary.  MyChart is used to connect with patients for Virtual Visits (Telemedicine).  Patients are able to view lab/test results, encounter notes, upcoming appointments, etc.  Non-urgent messages can be sent to your provider as well.   To learn more about what you can do with MyChart, go to [email or web address].    Your next appointment:   3 month(s)  The format for your next appointment:   In Person  Provider:   Berniece Salines, DO   Other Instructions      Adopting a Healthy Lifestyle.  Know what a healthy weight is for you (roughly BMI <25) and aim to maintain this   Aim for 7+ servings of fruits and vegetables daily   65-80+ fluid ounces of water or unsweet tea for healthy kidneys   Limit to max 1 drink of alcohol per day; avoid smoking/tobacco   Limit animal fats in diet for cholesterol and heart health - choose grass fed whenever available   Avoid highly processed  foods, and foods high in saturated/trans fats  Aim for low stress - take time to unwind and care for your mental health   Aim for 150 min of moderate intensity exercise weekly for heart health, and weights twice weekly for bone health   Aim for 7-9 hours of sleep daily   When it comes to diets, agreement about the perfect plan isnt easy to find, even among the experts. Experts at the [place] developed an idea known as the Healthy Eating Plate. Just imagine a plate divided into logical, healthy portions.   The emphasis is on diet quality:   Load up on vegetables and fruits - one-half of your plate: Aim for color and variety, and remember that potatoes dont count.   Go for whole grains - one-quarter of your plate: Whole wheat, barley, wheat berries, quinoa, oats, brown rice, and foods made with them. If you want pasta, go with whole wheat pasta.   Protein power - one-quarter of your plate: Fish, chicken, beans, and nuts are all healthy, versatile protein sources. Limit red meat.   The diet, however, does go beyond the plate, offering a few other suggestions.   Use healthy plant oils, such as olive, canola, soy, corn, sunflower and peanut. Check the labels, and avoid partially hydrogenated oil, which have unhealthy trans fats.   If youre thirsty, drink water. Coffee and tea are good in moderation, but skip sugary drinks and limit milk and dairy products to one or two daily servings.   The type of carbohydrate in the diet is more important than the amount. Some sources of carbohydrates, such as vegetables, fruits, whole grains, and beans-are healthier than others.   Finally, stay active  Signed, Berniece Salines, DO  07/11/2020 5:36 PM    [facility]

## 2020-07-14 DIAGNOSIS — M7989 Other specified soft tissue disorders: Secondary | ICD-10-CM | POA: Diagnosis not present

## 2020-07-14 DIAGNOSIS — R109 Unspecified abdominal pain: Secondary | ICD-10-CM | POA: Diagnosis not present

## 2020-07-14 DIAGNOSIS — R600 Localized edema: Secondary | ICD-10-CM | POA: Diagnosis not present

## 2020-07-14 DIAGNOSIS — R935 Abnormal findings on diagnostic imaging of other abdominal regions, including retroperitoneum: Secondary | ICD-10-CM | POA: Diagnosis not present

## 2020-07-14 DIAGNOSIS — M47816 Spondylosis without myelopathy or radiculopathy, lumbar region: Secondary | ICD-10-CM | POA: Diagnosis not present

## 2020-07-14 DIAGNOSIS — I700 Atherosclerosis of aorta: Secondary | ICD-10-CM | POA: Diagnosis not present

## 2020-07-14 DIAGNOSIS — N3289 Other specified disorders of bladder: Secondary | ICD-10-CM | POA: Diagnosis not present

## 2020-07-14 DIAGNOSIS — E2749 Other adrenocortical insufficiency: Secondary | ICD-10-CM | POA: Diagnosis not present

## 2020-07-18 ENCOUNTER — Telehealth (HOSPITAL_COMMUNITY): Payer: Self-pay | Admitting: Emergency Medicine

## 2020-07-18 ENCOUNTER — Telehealth (HOSPITAL_COMMUNITY): Payer: Self-pay | Admitting: *Deleted

## 2020-07-18 NOTE — Telephone Encounter (Signed)
Pt calling about her cardiac CT appointment stating she wants something to help with claustrophobia.  Pt educated about how CT scans are different than MRIs.  Pt thinks she will be ok without medication.  Pt requested that she be called back on Monday to go over instructions as she doesn't have a good memory.  Phone number in chart confirmed and pt informed that she would receive a call from the RN navigators on Monday.  Gordy Clement RN Navigator Cardiac Imaging [facility] Services [phone] Office [phone] Cell

## 2020-07-18 NOTE — Telephone Encounter (Signed)
vm box full .  Marchia Bond RN Navigator Cardiac Imaging [facility] Heart and Vascular Services [phone] Office  [phone] Cell

## 2020-07-21 ENCOUNTER — Telehealth (HOSPITAL_COMMUNITY): Payer: Self-pay | Admitting: Emergency Medicine

## 2020-07-21 ENCOUNTER — Other Ambulatory Visit: Payer: Self-pay

## 2020-07-21 MED ORDER — LORAZEPAM 1 MG PO TABS: ORAL_TABLET | ORAL | 0 refills | Status: DC

## 2020-07-21 NOTE — Progress Notes (Signed)
Ativan 1 mg called into Pharmacy for patient's CT.

## 2020-07-21 NOTE — Telephone Encounter (Signed)
Reaching out to patient to offer assistance regarding upcoming cardiac imaging study; pt verbalizes understanding of appt date/time, parking situation and where to check in, pre-test NPO status and medications ordered, and verified current allergies; name and call back number provided for further questions should they arise Marchia Bond RN Navigator Cardiac Imaging [facility] [phone] office [phone] cell  Pt very confused and needs a lot of guidance related to managing her health.  [place] aware. Pt verbalized understanding to take diltiazem as prescribed Clarise Cruz

## 2020-07-21 NOTE — Telephone Encounter (Signed)
Returning phone call after patient left a vm on my machine over the weekend requesting a dose of antianxiety medication prior to cardiac CT appt for claustrophobia.  Attempted to call patient back but she was in the middle of another dr appt. Will try to talk to patient after lunch  [facility] [phone] Office  [phone] Cell

## 2020-07-22 ENCOUNTER — Other Ambulatory Visit: Payer: Self-pay

## 2020-07-22 ENCOUNTER — Ambulatory Visit (HOSPITAL_COMMUNITY)
Admission: RE | Admit: 2020-07-22 | Discharge: 2020-07-22 | Disposition: A | Discharge status: Home / Self Care | Payer: Medicare Other | Source: Ambulatory Visit | Attending: Cardiology | Admitting: Cardiology

## 2020-07-22 ENCOUNTER — Encounter (HOSPITAL_COMMUNITY): Payer: Self-pay

## 2020-07-22 DIAGNOSIS — I2510 Atherosclerotic heart disease of native coronary artery without angina pectoris: Secondary | ICD-10-CM | POA: Insufficient documentation

## 2020-07-22 DIAGNOSIS — R079 Chest pain, unspecified: Secondary | ICD-10-CM | POA: Insufficient documentation

## 2020-07-22 DIAGNOSIS — I700 Atherosclerosis of aorta: Secondary | ICD-10-CM | POA: Insufficient documentation

## 2020-07-22 MED ORDER — NITROGLYCERIN 0.4 MG SL SUBL: 0.8000 mg | SUBLINGUAL_TABLET | Freq: Once | SUBLINGUAL | Status: AC

## 2020-07-22 MED ORDER — NITROGLYCERIN 0.4 MG SL SUBL
SUBLINGUAL_TABLET | SUBLINGUAL | Status: AC
  Filled 2020-07-22: qty 1

## 2020-07-22 MED ORDER — IOHEXOL 350 MG/ML SOLN
80.0000 mL | Freq: Once | INTRAVENOUS | Status: AC | PRN
  Administered 2020-07-22: 80 mL via INTRAVENOUS

## 2020-07-22 MED ORDER — NITROGLYCERIN 0.4 MG SL SUBL
SUBLINGUAL_TABLET | SUBLINGUAL | Status: AC
  Administered 2020-07-22: 0.8 mg via SUBLINGUAL
  Filled 2020-07-22: qty 2

## 2020-07-23 ENCOUNTER — Ambulatory Visit (HOSPITAL_COMMUNITY)
Admission: RE | Admit: 2020-07-23 | Discharge: 2020-07-23 | Disposition: A | Discharge status: Home / Self Care | Payer: Medicare Other | Source: Ambulatory Visit | Attending: Cardiology | Admitting: Cardiology

## 2020-07-23 DIAGNOSIS — R079 Chest pain, unspecified: Secondary | ICD-10-CM | POA: Insufficient documentation

## 2020-07-25 ENCOUNTER — Telehealth: Payer: Self-pay | Admitting: Cardiology

## 2020-07-25 NOTE — Telephone Encounter (Signed)
    Pt is returning call to get CT result 

## 2020-07-28 ENCOUNTER — Other Ambulatory Visit: Payer: Self-pay

## 2020-07-28 MED ORDER — ROSUVASTATIN CALCIUM 10 MG PO TABS: 10.0000 mg | ORAL_TABLET | Freq: Every day | ORAL | 3 refills | Status: DC

## 2020-07-28 NOTE — Progress Notes (Signed)
Spoke with patient about stopping  Simvastatin and starting Crestor 10 mg and Aspirin 81 mg daily. Patient states she is unsure which pill she needs to stop due to the pills being prepackaged and not being able to read the bottle. Patient encouraged to write down the name of the medication and show her husband since he takes care of her medications. Patient does so and will call the drug store to see if they can tell her what the Simvastatin looks like. Patient will pick up OTC low-dose aspirin today. Patient verbalizes understanding. No further questions or concerns at this time.  Prescription sent to [facility], pharmacy change per patient.

## 2020-07-30 ENCOUNTER — Telehealth: Payer: Self-pay | Admitting: Cardiology

## 2020-07-30 MED ORDER — ROSUVASTATIN CALCIUM 10 MG PO TABS: 10.0000 mg | ORAL_TABLET | Freq: Every day | ORAL | 3 refills | Status: DC

## 2020-07-30 MED ORDER — DILTIAZEM HCL ER COATED BEADS 120 MG PO CP24: 120.0000 mg | ORAL_CAPSULE | Freq: Every day | ORAL | 3 refills | Status: AC

## 2020-07-30 NOTE — Telephone Encounter (Signed)
*  STAT* If patient is at the pharmacy, call can be transferred to refill team.   1. Which medications need to be refilled? (please list name of each medication and dose if known)   diltiazem (CARDIZEM CD) 120 MG 24 hr capsule  rosuvastatin (CRESTOR) 10 MG tablet  2. Which pharmacy/location (including street and city if local pharmacy) is medication to be sent to?  [facility] - [place], [place] - [place] [address]  3. Do they need a 30 day or 90 day supply? 90 day supply

## 2020-07-30 NOTE — Telephone Encounter (Signed)
Refill sent to pharmacy.   

## 2020-08-04 DIAGNOSIS — E2749 Other adrenocortical insufficiency: Secondary | ICD-10-CM | POA: Diagnosis not present

## 2020-08-04 DIAGNOSIS — L03116 Cellulitis of left lower limb: Secondary | ICD-10-CM | POA: Diagnosis not present

## 2020-08-04 DIAGNOSIS — Z681 Body mass index (BMI) 19 or less, adult: Secondary | ICD-10-CM | POA: Diagnosis not present

## 2020-08-04 DIAGNOSIS — M7989 Other specified soft tissue disorders: Secondary | ICD-10-CM | POA: Diagnosis not present

## 2020-08-13 ENCOUNTER — Other Ambulatory Visit: Payer: Self-pay | Admitting: *Deleted

## 2020-08-13 ENCOUNTER — Encounter: Payer: Self-pay | Admitting: *Deleted
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
## 2020-08-15 ENCOUNTER — Other Ambulatory Visit: Payer: Self-pay | Admitting: *Deleted

## 2020-08-15 NOTE — Patient Outreach (Signed)
[place] Care Management  08/15/2020  [name] [name] [birth year] [id number]   Incoming call received back from [facility].  Spoke with Watt Climes regarding member's wishes to change providers for in home aide care.  She report call will need to be made directly to [facility] in [place] to submit transfer request.    Call placed [place], notified that member would have to call herself to make this request.  Representative advised that member is blind and unable to make call herself.  She was able to call member directly to begin transfer request but state member had to end call due to receiving another call.  This care manager was requested to have member follow up with them to complete request.    Will follow up with member within the next 2 business days to follow up with [place].  [name], [place], MSN [place] [phone]

## 2020-08-18 ENCOUNTER — Other Ambulatory Visit: Payer: Self-pay | Admitting: *Deleted

## 2020-08-18 DIAGNOSIS — H543 Unqualified visual loss, both eyes: Secondary | ICD-10-CM

## 2020-08-18 NOTE — Patient Outreach (Signed)
[place] [facility]) Care Management  08/18/2020  [name] 08-Apr-1966 [id number]   Outgoing call placed to member to follow up on request to change PCS providers.  Advised that she needed to call to complete request.  Conference call placed to [facility] in [place], notified that member actually completed request last week and [facility] will be calling her this week to start services.  Member is open to being connected to community resources in the area for the blind as well as food resources (meals on wheels).  Referral to care guide placed.  Denies any urgent concerns, encouraged to contact this care manager with questions.  Agrees to follow up within the next week to confirm PCS services have started.  [name], [place], MSN [place] [phone]

## 2020-08-19 ENCOUNTER — Ambulatory Visit: Payer: Self-pay | Admitting: *Deleted

## 2020-08-20 ENCOUNTER — Telehealth: Payer: Self-pay

## 2020-08-20 NOTE — Telephone Encounter (Signed)
   Telephone encounter was:  Unsuccessful.  08/20/2020 Name: [name] MRN: [id number] DOB: [birth year]  Unsuccessful outbound call made today to assist with:  Services for the blind, financial and food resources.  Unable to leave message voicemail full.  Outreach Attempt:  1st Attempt   Hayleen Clinkscales, AAS Paralegal, [place] . Embedded Care Coordination [facility] Health  Care Management  [address], [place] [address] ??[email or web address]  ?? [phone]   [email or web address]

## 2020-08-21 ENCOUNTER — Other Ambulatory Visit: Payer: Self-pay | Admitting: *Deleted

## 2020-08-21 NOTE — Patient Outreach (Signed)
[place] [facility]) Care Management  08/21/2020  [name] [birth year] [id number]   Incoming call received from PCP office, [name], regarding concern for in home aide services.  State member called office asking when services would be started.  This RNCM called [facility] to follow up on referral/provider change,  Informed that all paperwork is completed and member will be getting call today to set up services.  Outgoing call placed to member to provide update.  She verbalizes understanding, will follow up within the next week to confirm services has started.  [name], [place], MSN [place] [phone]

## 2020-08-22 ENCOUNTER — Telehealth: Payer: Self-pay

## 2020-08-22 NOTE — Telephone Encounter (Signed)
   Telephone encounter was:  Successful.  08/22/2020 Name: [name] MRN: [id number] DOB: [birth year]  [name] [name] is a 55 y.o. year old female who is a primary care patient of Burgart, Anderson Malta, MD . The community resource team was consulted for assistance with Spoke with patient about resources for the blind and food pantries.  Patient has to be 60 to be eligible for meals on wheels.  Emailed information to patient she told me that her aide can assist her with reading it and programming the numbers in her phone.  Care guide performed the following interventions: Patient provided with information about care guide support team and interviewed to confirm resource needs Discussed resources to assist with services for the blind,  [facility] [employer] [facility], Vocational Rehab, and food pantries.  Follow Up Plan:  Care guide will follow up with patient by phone over the next 7 days  [name], AAS Paralegal, [place] . Embedded Care Coordination [facility] Health  Care Management  [address], [place] [address] ??[email or web address]  ?? [phone]   [email or web address]

## 2020-08-26 ENCOUNTER — Telehealth: Payer: Self-pay

## 2020-08-26 NOTE — Telephone Encounter (Signed)
   Telephone encounter was:  Unsuccessful.  08/26/2020 Name: [name] MRN: [id number] DOB: December 11, 1965  Unsuccessful outbound call made today to assist with:  food insecurity and services for the blind  Outreach Attempt:  2nd Attempt  Unable to leave message voicemail is full.  Yania Bogie, AAS Paralegal, [place] . Embedded Care Coordination [facility] Health  Care Management  [address], [place] [address] ??[email or web address]  ?? [phone]   [email or web address]

## 2020-08-27 ENCOUNTER — Other Ambulatory Visit: Payer: Self-pay | Admitting: *Deleted

## 2020-08-27 DIAGNOSIS — I350 Nonrheumatic aortic (valve) stenosis: Secondary | ICD-10-CM | POA: Diagnosis not present

## 2020-08-27 DIAGNOSIS — N3001 Acute cystitis with hematuria: Secondary | ICD-10-CM | POA: Diagnosis not present

## 2020-08-27 DIAGNOSIS — Z681 Body mass index (BMI) 19 or less, adult: Secondary | ICD-10-CM | POA: Diagnosis not present

## 2020-08-27 DIAGNOSIS — R0600 Dyspnea, unspecified: Secondary | ICD-10-CM | POA: Diagnosis not present

## 2020-08-27 DIAGNOSIS — M7989 Other specified soft tissue disorders: Secondary | ICD-10-CM | POA: Diagnosis not present

## 2020-08-27 DIAGNOSIS — J441 Chronic obstructive pulmonary disease with (acute) exacerbation: Secondary | ICD-10-CM | POA: Diagnosis not present

## 2020-08-27 NOTE — Patient Outreach (Signed)
[place] [facility]) Care Management  08/27/2020  [name] [name] 02-02-1966 [id number]   Outgoing call placed to member to follow up on start of in home aide services by new provider.  Confirms that she did have someone come out on Saturday however she was not pleased with the experience.  Report the aide "didn't do anything because she left her gloves."  Also state aide had to leave earlier than assigned time due to personal issue.  She has spoken to agency and they are sending a new person today.  If she continues to have problems, she is willing to change providers again.  Agrees to follow up within the next 2 weeks to assess plan of care.  Goals Addressed            This Visit's Progress   . Find Help in My Community   On track    Timeframe:  Short-Term Goal Priority:  High Start Date:         3/9                    Expected End Date: 4/9                      Follow Up Date 3/14    - follow-up on any referrals for help I am given - think ahead to make sure my need does not become an emergency    Why is this important?    Knowing how and where to find help for yourself or family in your neighborhood and community is an important skill.   You will want to take some steps to learn how.    Notes:     Marland Kitchen Matintain My Quality of Life   On track    Timeframe:  Long-Range Goal Priority:  Medium Start Date:        3/9                     Expected End Date:   6/9                    Follow Up Date 3/14   - check out options for in-home help, long-term care or hospice - discuss my treatment options with the doctor or nurse - make shared treatment decisions with doctor    Why is this important?    Having a long-term illness can be scary.   It can also be stressful for you and your caregiver.   These steps may help.    Notes:       [name], RN, MSN [place] [phone]

## 2020-08-28 ENCOUNTER — Telehealth: Payer: Self-pay

## 2020-08-28 NOTE — Telephone Encounter (Signed)
   Telephone encounter was:  Successful.  08/28/2020 Name: [name] MRN: [id number] DOB: 09-08-1965  [name] Guillermo is a 55 y.o. year old female who is a primary care patient of Burgart, Anderson Malta, MD . The community resource team was consulted for assistance with Food Insecurity and services for the blind  Care guide performed the following interventions: Follow up call placed to the patient to discuss status of referral Patient gave permission for Karna Dupes of [employer] to speak with me.  Cecille Rubin asked me to email resources to her and she would help Ms. Helin put the resources in her phone.   Received receipt that email was received.              .  Follow Up Plan:  No further follow up planned at this time. The patient has been provided with needed resources.  Nare Gaspari, AAS Paralegal, [place] . Embedded Care Coordination [facility] Health  Care Management  [address], [place] [address] ??[email or web address]  ?? [phone]   [email or web address]

## 2020-09-03 DIAGNOSIS — D649 Anemia, unspecified: Secondary | ICD-10-CM | POA: Diagnosis not present

## 2020-09-03 DIAGNOSIS — R0600 Dyspnea, unspecified: Secondary | ICD-10-CM | POA: Diagnosis not present

## 2020-09-03 DIAGNOSIS — N6019 Diffuse cystic mastopathy of unspecified breast: Secondary | ICD-10-CM | POA: Diagnosis not present

## 2020-09-04 DIAGNOSIS — Z681 Body mass index (BMI) 19 or less, adult: Secondary | ICD-10-CM | POA: Diagnosis not present

## 2020-09-04 DIAGNOSIS — J40 Bronchitis, not specified as acute or chronic: Secondary | ICD-10-CM | POA: Diagnosis not present

## 2020-09-04 DIAGNOSIS — D72819 Decreased white blood cell count, unspecified: Secondary | ICD-10-CM | POA: Diagnosis not present

## 2020-09-04 DIAGNOSIS — J329 Chronic sinusitis, unspecified: Secondary | ICD-10-CM | POA: Diagnosis not present

## 2020-09-04 DIAGNOSIS — N3001 Acute cystitis with hematuria: Secondary | ICD-10-CM | POA: Diagnosis not present

## 2020-09-04 DIAGNOSIS — B370 Candidal stomatitis: Secondary | ICD-10-CM | POA: Diagnosis not present

## 2020-09-10 DIAGNOSIS — F17200 Nicotine dependence, unspecified, uncomplicated: Secondary | ICD-10-CM | POA: Diagnosis not present

## 2020-09-10 DIAGNOSIS — B370 Candidal stomatitis: Secondary | ICD-10-CM | POA: Diagnosis not present

## 2020-09-10 DIAGNOSIS — R053 Chronic cough: Secondary | ICD-10-CM | POA: Diagnosis not present

## 2020-09-10 DIAGNOSIS — Z681 Body mass index (BMI) 19 or less, adult: Secondary | ICD-10-CM | POA: Diagnosis not present

## 2020-09-10 DIAGNOSIS — D72810 Lymphocytopenia: Secondary | ICD-10-CM | POA: Diagnosis not present

## 2020-09-10 DIAGNOSIS — N3001 Acute cystitis with hematuria: Secondary | ICD-10-CM | POA: Diagnosis not present

## 2020-09-10 DIAGNOSIS — D693 Immune thrombocytopenic purpura: Secondary | ICD-10-CM | POA: Diagnosis not present

## 2020-09-11 ENCOUNTER — Other Ambulatory Visit: Payer: Self-pay | Admitting: *Deleted

## 2020-09-11 NOTE — Patient Outreach (Signed)
[place] [facility]) Care Management  09/11/2020  [name] [name] 05/22/1966 [id number]   Outgoing call placed to member to follow up on in home aide service.  State she is very happy with the aide that is now assigned to her.  She does report concern for the need to increase hours.  State she is getting 2-3 hours daily but would benefit from more.  Also report she was seen by PCP yesterday, diagnosed with UTI and prescription for antibiotics sent to pharmacy.  [facility] will deliver this today.  This care manager placed call to PCP office to discuss member's interest in increasing hours, order will be placed.  Denies any urgent concerns, encouraged to contact this care manager with questions.  Agrees to follow up within the next month.  Goals Addressed            This Visit's Progress   . Find Help in My Community   On track    Timeframe:  Short-Term Goal Priority:  High Start Date:         3/9                    Expected End Date: 4/9                         - follow-up on any referrals for help I am given - think ahead to make sure my need does not become an emergency    Why is this important?    Knowing how and where to find help for yourself or family in your neighborhood and community is an important skill.   You will want to take some steps to learn how.    Notes:   4/7 - Call placed to PCP office to request order to increase hours for in home aides    . Matintain My Quality of Life   On track    Timeframe:  Long-Range Goal Priority:  Medium Start Date:        3/9                     Expected End Date:   6/9                       - check out options for in-home help, long-term care or hospice - discuss my treatment options with the doctor or nurse - make shared treatment decisions with doctor    Why is this important?    Having a long-term illness can be scary.   It can also be stressful for you and your caregiver.   These steps may  help.    Notes:   4/7 - Discussed long term plan to continue living independently       [name], Therapist, sports, MSN [place] Manager [phone]

## 2020-09-25 ENCOUNTER — Other Ambulatory Visit: Payer: Self-pay | Admitting: *Deleted

## 2020-09-25 NOTE — Patient Outreach (Signed)
[place] [facility]) Care Management  09/25/2020  [name] [name] [birth year] [id number]   Incoming call received from [place] with [facility], state member's PCS agency is not working out and she will need to choose a new company.  [name] has been in contact with administrator with [facility], coverage has either been sporadic or assigned aide has been restricted from [facility] apartment complex.  Call placed to member, confirms she is ready for new agency.  Conference calls were placed to [facility] and [facility].  [place] number is no longer in service.  [facility] is waiting on paperwork to be completed for new hire and they will be able to accommodate member.  Conference call was also placed to [facility] to request change, they report this request can't be made on 3-way call.  Advised that member is legally blind and may not be able to call independently.  They will call member directly to proceed with request.  This care manager will call member back within the next week.  [name], [place], MSN [place] [phone]

## 2020-10-03 ENCOUNTER — Other Ambulatory Visit: Payer: Self-pay

## 2020-10-03 ENCOUNTER — Other Ambulatory Visit: Payer: Self-pay | Admitting: *Deleted

## 2020-10-03 NOTE — Patient Outreach (Signed)
[place] [facility]) Care Management  10/03/2020  [name] [name] September 30, 1965 [id number]   Outgoing call placed to member to follow up on request to change PCS provider.  No answer, unable to leave voice message.  Will follow up within the next 3-4 business days.    Update:  Incoming call received back from member.  State she has not heard from [facility] regarding the switch to [facility].  Report she has been getting aide services from [facility] but she is still wanting to change providers.  Conference call placed to [place], spoke to [place].  Notified that change request was completed however someone contacted member yesterday and she told them she didn't want to change.  Member confirms again today that she would like to proceed with change.  They are unable to proceed with this request on 3-way call and will call member directly.  This care manager will follow up with member within the next week to confirm this has done.  She is advised to contact this care manager with concerns.  [name], [place], MSN [place] [phone]

## 2020-10-04 DIAGNOSIS — D649 Anemia, unspecified: Secondary | ICD-10-CM | POA: Diagnosis not present

## 2020-10-04 DIAGNOSIS — N6019 Diffuse cystic mastopathy of unspecified breast: Secondary | ICD-10-CM | POA: Diagnosis not present

## 2020-10-07 ENCOUNTER — Other Ambulatory Visit: Payer: Self-pay

## 2020-10-07 ENCOUNTER — Ambulatory Visit (INDEPENDENT_AMBULATORY_CARE_PROVIDER_SITE_OTHER): Payer: Medicare Other | Admitting: Cardiology

## 2020-10-07 ENCOUNTER — Encounter: Payer: Self-pay | Admitting: Cardiology

## 2020-10-07 VITALS — BP 158/62 | HR 77 | Ht 66.0 in | Wt 123.0 lb

## 2020-10-07 DIAGNOSIS — I350 Nonrheumatic aortic (valve) stenosis: Secondary | ICD-10-CM | POA: Diagnosis not present

## 2020-10-07 DIAGNOSIS — E119 Type 2 diabetes mellitus without complications: Secondary | ICD-10-CM

## 2020-10-07 DIAGNOSIS — R0600 Dyspnea, unspecified: Secondary | ICD-10-CM

## 2020-10-07 DIAGNOSIS — R0609 Other forms of dyspnea: Secondary | ICD-10-CM

## 2020-10-07 DIAGNOSIS — I2510 Atherosclerotic heart disease of native coronary artery without angina pectoris: Secondary | ICD-10-CM | POA: Diagnosis not present

## 2020-10-07 DIAGNOSIS — Z720 Tobacco use: Secondary | ICD-10-CM

## 2020-10-07 DIAGNOSIS — R600 Localized edema: Secondary | ICD-10-CM | POA: Diagnosis not present

## 2020-10-07 DIAGNOSIS — I10 Essential (primary) hypertension: Secondary | ICD-10-CM

## 2020-10-07 MED ORDER — FUROSEMIDE 20 MG PO TABS: 20.0000 mg | ORAL_TABLET | Freq: Every day | ORAL | 3 refills | Status: AC

## 2020-10-07 MED ORDER — POTASSIUM CHLORIDE ER 10 MEQ PO TBCR: 10.0000 meq | EXTENDED_RELEASE_TABLET | Freq: Every day | ORAL | 3 refills | Status: AC

## 2020-10-07 MED ORDER — ASPIRIN EC 81 MG PO TBEC: 81.0000 mg | DELAYED_RELEASE_TABLET | Freq: Every day | ORAL | 3 refills | Status: AC

## 2020-10-07 MED ORDER — ROSUVASTATIN CALCIUM 20 MG PO TABS: 20.0000 mg | ORAL_TABLET | Freq: Every day | ORAL | 3 refills | Status: AC

## 2020-10-07 NOTE — Addendum Note (Signed)
Addended by: Orvan July on: 10/07/2020 05:19 PM   Modules accepted: Orders

## 2020-10-07 NOTE — Patient Instructions (Addendum)
Medication Instructions:  Your physician has recommended you make the following change in your medication: START: Lasix 20 mg once daily START: Potassium 10 meq once daily START: Aspirin 81 mg once daily INCREASE:  Crestor 20 mg once daily *If you need a refill on your cardiac medications before your next appointment, please call your pharmacy*   Lab Work: Your physician recommends that you return for lab work: TODAY: BMET, [place] If you have labs (blood work) drawn today and your tests are completely normal, you will receive your results only by: Marland Kitchen MyChart Message (if you have MyChart) OR . A paper copy in the mail If you have any lab test that is abnormal or we need to change your treatment, we will call you to review the results.   Testing/Procedures: None   Follow-Up: At [facility], you and your health needs are our priority.  As part of our continuing mission to provide you with exceptional heart care, we have created designated Provider Care Teams.  These Care Teams include your primary Cardiologist (physician) and Advanced Practice Providers (APPs -  Physician Assistants and Nurse Practitioners) who all work together to provide you with the care you need, when you need it.  We recommend signing up for the patient portal called "MyChart".  Sign up information is provided on this After Visit Summary.  MyChart is used to connect with patients for Virtual Visits (Telemedicine).  Patients are able to view lab/test results, encounter notes, upcoming appointments, etc.  Non-urgent messages can be sent to your provider as well.   To learn more about what you can do with MyChart, go to [email or web address].    Your next appointment:   4 month(s)  The format for your next appointment:   In Person  Provider:   Berniece Salines, DO   Other Instructions

## 2020-10-07 NOTE — Progress Notes (Signed)
Cardiology Office Note:    Date:  10/07/2020   ID:  [name] [name], DOB 10/08/1965, MRN [id number]  PCP:  Serita Grammes, MD  Cardiologist:  Berniece Salines, DO  Electrophysiologist:  None   Referring MD: Serita Grammes, MD   My shortness of breath is getting worse  History of Present Illness:    [name] is a 55 y.o. female with a hx of moderate aortic stenosis on her recent [place] score of the aortic valve 2127 suggesting severe aortic stenosis, coronary artery disease, smoker,hyperlipidemia, hypertension, COPDhere today for a follow up visit.   I last saw the patient on May 29, 2020 at that time she presented because she was having significant palpitation and had a syncope episode.  On physical exam she had a murmur therefore ZIO monitor and echocardiogram was ordered. In the interim the patient had an echocardiogram which showed moderate aortic stenosis.  She also had resume monitor which showed paroxysmal atrial tachycardia.  At her last visit in February 2022 the patient complained of chest pain she was referred for coronary CTA.  These results has been called out to the patient. Today she is here with a caregiver she tells me she experiencing significant shortness of breath on exertion.  According to her caregiver her symptoms are worsening.  She can barely do anything without getting short of breath.   Past Medical History:  Diagnosis Date  . Anemia   . Anxiety   . Arthritis   . Asthma   . Bacteremia 06/30/2013  . Breast infection 03/21/2012  . CHF (congestive heart failure) ([place])    hx CHF 1 year ago in [facility]  . Confusion 06/25/2013  . COPD (chronic obstructive pulmonary disease) ([place])   . COPD exacerbation ([place]) 06/26/2013  . Cough with congestion of paranasal sinus   . Depression   . Diabetes mellitus without complication ([place])    on meds  . Fibromyalgia 03/31/2012  . GERD (gastroesophageal reflux  disease)   . Heart murmur   . Hemophilus influenzae (H. influenzae) infection in conditions classified elsewhere and of unspecified site 06/30/2013  . History of MRSA infection 03/31/2012   Formatting of this note might be different from the original. Rt arm 02/2012 per pt in setting of spider bite  . Hyperlipidemia 06/25/2013  . Hypertension 03/31/2012  . Insomnia 06/25/2013  . Mastitis chronic 03/06/2012  . Neuropathy 06/25/2013  . Pneumonia 2015  . Preop examination 03/15/2016   Formatting of this note might be different from the original. Added automatically from request for surgery [phone]  . PTSD (post-traumatic stress disorder) 06/25/2013  . Respiratory failure with hypoxia ([place]) 06/26/2013  . Shoulder pain 07/01/2013  . Sinus congestion   . Tobacco abuse 06/25/2013  . Weakness 06/25/2013    Past Surgical History:  Procedure Laterality Date  . ABDOMINAL HYSTERECTOMY  1998   tubal pregnancy right side partial hysterectomy  . APPENDECTOMY    . BREAST SURGERY Bilateral    chronic mastitis  . CATARACT EXTRACTION W/ INTRAOCULAR LENS  IMPLANT, BILATERAL    . MULTIPLE EXTRACTIONS WITH ALVEOLOPLASTY N/A 08/09/2014   Procedure: MULTIPLE EXTRACTIONS WITH ALVEOLOPLASTY ;  Surgeon: Diona Browner, DDS;  Location: [facility];  Service: Oral Surgery;  Laterality: N/A;    Current Medications: Current Meds  Medication Sig  . albuterol (VENTOLIN HFA) 108 (90 Base) MCG/ACT inhaler Inhale 1-2 puffs into the lungs daily. 20 minutes prior to walking a long distance  . azelastine (ASTELIN)  0.1 % nasal spray Place into both nostrils 2 (two) times daily. Use in each nostril as directed Patient states she will start medication 07/23/2020.  . diltiazem (CARDIZEM CD) 120 MG 24 hr capsule Take 1 capsule (120 mg total) by mouth daily.  Marland Kitchen doxycycline (VIBRAMYCIN) 100 MG capsule Take 100 mg by mouth 2 (two) times daily.  Marland Kitchen escitalopram (LEXAPRO) 10 MG tablet Take 10 mg by mouth daily. Patient states she will start  medication 07/23/2020.  . fluticasone (FLONASE) 50 MCG/ACT nasal spray Place 2 sprays into both nostrils daily.  . furosemide (LASIX) 20 MG tablet Take 1 tablet (20 mg total) by mouth daily.  Marland Kitchen levocetirizine (XYZAL) 5 MG tablet Take 5 mg by mouth every evening. Patient states she will start medication 07/23/2020.  Marland Kitchen LORazepam (ATIVAN) 1 MG tablet Take with you to scan  . montelukast (SINGULAIR) 10 MG tablet Take 10 mg by mouth at bedtime. Patient states she will start medication 07/23/2020.  . nitrofurantoin (MACRODANTIN) 100 MG capsule Take 100 mg by mouth 2 (two) times daily.  Marland Kitchen nystatin (MYCOSTATIN) 100000 UNIT/ML suspension Take 5 mLs by mouth 3 (three) times daily.  . pantoprazole (PROTONIX) 40 MG tablet Take 40 mg by mouth daily. Patient states she will start medication 07/23/2020.  Marland Kitchen potassium chloride (KLOR-CON) 10 MEQ tablet Take 1 tablet (10 mEq total) by mouth daily.  . promethazine-dextromethorphan (PROMETHAZINE-DM) 6.25-15 MG/5ML syrup Take 5 mLs by mouth at bedtime as needed.  . rosuvastatin (CRESTOR) 10 MG tablet Take 1 tablet (10 mg total) by mouth daily.  . valACYclovir (VALTREX) 1000 MG tablet Take 1,000 mg by mouth 2 (two) times daily.  . [DISCONTINUED] clindamycin (CLEOCIN) 300 MG capsule Take 300 mg by mouth 3 (three) times daily.  . [DISCONTINUED] furosemide (LASIX) 20 MG tablet Take 20 mg by mouth daily as needed.     Allergies:   Clarithromycin, Penicillins, Wound dressing adhesive, Other, Propoxyphene, Sulfamethoxazole, Chocolate, Percocet [oxycodone-acetaminophen], Ranitidine, Sulfa antibiotics, Tape, Coconut fatty acids, Codeine, and Oxycodone-acetaminophen   Social History   Socioeconomic History  . Marital status: Married    Spouse name: Not on file  . Number of children: Not on file  . Years of education: Not on file  . Highest education level: Not on file  Occupational History  . Not on file  Tobacco Use  . Smoking status: Current Every Day Smoker     Packs/day: 0.25    Years: 32.00    Pack years: 8.00    Types: Cigarettes  . Smokeless tobacco: Never Used  Substance and Sexual Activity  . Alcohol use: No  . Drug use: No  . Sexual activity: Not on file  Other Topics Concern  . Not on file  Social History Narrative  . Not on file   Social Determinants of Health   Financial Resource Strain: Not on file  Food Insecurity: Food Insecurity Present  . Worried About Charity fundraiser in the Last Year: Sometimes true  . Ran Out of Food in the Last Year: Sometimes true  Transportation Needs: Unmet Transportation Needs  . Lack of Transportation (Medical): Yes  . Lack of Transportation (Non-Medical): Yes  Physical Activity: Not on file  Stress: Not on file  Social Connections: Not on file     Family History: The patient's family history includes Hypertension in her father and mother; Skin cancer in her maternal grandmother and mother; Stroke in her mother.  ROS:   Review of Systems  Constitution: Negative for decreased  appetite, fever and weight gain.  HENT: Negative for congestion, ear discharge, hoarse voice and sore throat.   Eyes: Negative for discharge, redness, vision loss in right eye and visual halos.  Cardiovascular: Reports dyspnea on exertion.  Negative for, leg swelling, orthopnea and palpitations.  Respiratory: Negative for cough, hemoptysis, shortness of breath and snoring.   Endocrine: Negative for heat intolerance and polyphagia.  Hematologic/Lymphatic: Negative for bleeding problem. Does not bruise/bleed easily.  Skin: Negative for flushing, nail changes, rash and suspicious lesions.  Musculoskeletal: Negative for arthritis, joint pain, muscle cramps, myalgias, neck pain and stiffness.  Gastrointestinal: Negative for abdominal pain, bowel incontinence, diarrhea and excessive appetite.  Genitourinary: Negative for decreased libido, genital sores and incomplete emptying.  Neurological: Negative for brief paralysis,  focal weakness, headaches and loss of balance.  Psychiatric/Behavioral: Negative for altered mental status, depression and suicidal ideas.  Allergic/Immunologic: Negative for HIV exposure and persistent infections.    EKGs/Labs/Other Studies Reviewed:    The following studies were reviewed today:   EKG: None today  Coronary CTA July 22, 2020 Aorta: Normal size.  Mild calcifications.  No dissection.  Aortic Valve: Trileaflet. Moderate calcifications. Aortic valve calcium score 2127.  Coronary calcium score 587  Coronary Arteries:  Normal coronary origin.  Left  dominance.  RCA is a small non-dominant artery.  There is no plaque.  Left main is a large artery that gives rise to LAD and LCX arteries. There is a mild calcified plaque in the ostium of the left main.  LAD is a large vessel. The proximal LAD with a mild (25-49%) calcified plaque. The mid LAD with multiple mild diffuse calcified plaques. The mid to distal LAD with no plaques.  LCX is a large dominant artery that gives rise to one large OM1 branch that gives rise to PDA. There is a lesion in the mid LCX which starts off as a mild calcified lesion and terminates to a moderate calcified lesion. The distal LCX with minimal calcified plaques.  Other findings:  Normal pulmonary vein drainage into the left atrium.  Normal left atrial appendage without a thrombus.  Normal size of the pulmonary artery.  IMPRESSION: 1. Coronary calcium score of 587. This was 59 percentile for age and sex matched control.  2. Normal coronary origin with left dominance.  3. Moderate CAD.  CADRADS 3.  This study will be sent for FFRct.  4. Aortic valve calcium score 2127, suggesting Severe Aortic valve stenosis.  5. Aortic atherosclerosis.  Zio Monitor  The patient wore the monitor for 3 days starting May 29, 2020. Indication: Palpitations  The minimum heart rate was 48 bpm, maximum heart rate was 200  bpm, and average heart rate was 71 bpm. Predominant underlying rhythm was Sinus Rhythm.  8 Supraventricular Tachycardia runs occurred, the run with the fastest interval lasting 5 beats with a maximum rate of 200 bpm, the longest lasting 7 beats with an average rate of 137 bpm.  Premature atrial complexes were rare less than 1%. Premature Ventricular complexes were rare less than 1%.  No ventricular tachycardia, no pauses, No AV block and no atrial fibrillation present.  11 patient triggered events most of which is associated with sinus rhythm.  Conclusion: This study is remarkable for supraventricular tachycardia which is likely paroxysmal atrial tachycardia with variable block.   TTE Impression  1. Left ventricular ejection fraction, by estimation, is 60 to 65%. The  left ventricle has normal function. The left ventricle has no regional  wall motion  abnormalities. Left ventricular diastolic parameters are  consistent with Grade I diastolic  dysfunction (impaired relaxation).  2. Right ventricular systolic function is normal. The right ventricular  size is normal. There is normal pulmonary artery systolic pressure.  3. The mitral valve is normal in structure. No evidence of mitral valve  regurgitation. No evidence of mitral stenosis.  4. The aortic valve is calcified. There is moderate calcification of the  aortic valve. There is moderate thickening of the aortic valve. Aortic  valve regurgitation is mild. Moderate aortic valve stenosis. Aortic valve  area, by VTI measures 1.29 cm.  Aortic valve mean gradient measures 30.0 mmHg. Aortic valve Vmax measures  3.66 m/s.  5. The inferior vena cava is normal in size with greater than 50%  respiratory variability, suggesting right atrial pressure of 3 mmHg.   Conclusion(s)/Recommendation(s): Moderate aortic stenosis.   FINDINGS  Left Ventricle: Left ventricular ejection fraction, by estimation, is 60  to 65%. The left  ventricle has normal function. The left ventricle has no  regional wall motion abnormalities. The left ventricular internal cavity  size was normal in size. There is  no left ventricular hypertrophy. Left ventricular diastolic parameters  are consistent with Grade I diastolic dysfunction (impaired relaxation).   Right Ventricle: The right ventricular size is normal. No increase in  right ventricular wall thickness. Right ventricular systolic function is  normal. There is normal pulmonary artery systolic pressure. The tricuspid  regurgitant velocity is 2.35 m/s, and  with an assumed right atrial pressure of 3 mmHg, the estimated right  ventricular systolic pressure is 35.3 mmHg.   Left Atrium: Left atrial size was normal in size.   Right Atrium: Right atrial size was normal in size.   Pericardium: There is no evidence of pericardial effusion.   Mitral Valve: The mitral valve is normal in structure. No evidence of  mitral valve regurgitation. No evidence of mitral valve stenosis.   Tricuspid Valve: The tricuspid valve is normal in structure. Tricuspid  valve regurgitation is not demonstrated. No evidence of tricuspid  stenosis.   Aortic Valve: The aortic valve is calcified. There is moderate  calcification of the aortic valve. There is moderate thickening of the  aortic valve. Aortic valve regurgitation is mild. Moderate aortic stenosis  is present. Aortic valve mean gradient  measures 30.0 mmHg. Aortic valve peak gradient measures 53.6 mmHg. Aortic  valve area, by VTI measures 1.29 cm.   Pulmonic Valve: The pulmonic valve was normal in structure. Pulmonic valve  regurgitation is not visualized. No evidence of pulmonic stenosis.   Aorta: The aortic root is normal in size and structure.   Venous: The inferior vena cava is normal in size with greater than 50%  respiratory variability, suggesting right atrial pressure of 3 mmHg.   IAS/Shunts: No atrial level shunt detected by  color flow Doppler.     Recent Labs: 05/29/2020: BNP 150.7; Hemoglobin 13.6; Platelets 93 07/09/2020: BUN 15; Creatinine, Ser 0.72; Magnesium 2.2; Potassium 4.5; Sodium 146  Recent Lipid Panel No results found for: CHOL, TRIG, HDL, CHOLHDL, VLDL, LDLCALC, LDLDIRECT  Physical Exam:    VS:  BP (!) 158/62   Pulse 77   Ht 5\' 6"  (1.676 m)   Wt 123 lb (55.8 kg)   SpO2 96%   BMI 19.85 kg/m     Wt Readings from Last 3 Encounters:  10/07/20 123 lb (55.8 kg)  07/09/20 123 lb 12.8 oz (56.2 kg)  05/29/20 125 lb 12.8 oz (57.1 kg)  GEN: Well nourished, well developed in no acute distress HEENT: Normal NECK: No JVD; No carotid bruits LYMPHATICS: No lymphadenopathy CARDIAC: S1S2 noted,RRR, 4/6 mid ejection systolic murmurs, rubs, gallops RESPIRATORY:  Clear to auscultation without rales, wheezing or rhonchi  ABDOMEN: Soft, non-tender, non-distended, +bowel sounds, no guarding. EXTREMITIES: +2 bilateral edema, No cyanosis, no clubbing MUSCULOSKELETAL:  No deformity  SKIN: Warm and dry NEUROLOGIC:  Alert and oriented x 3, non-focal PSYCHIATRIC:  Normal affect, good insight  ASSESSMENT:    1. Dyspnea on exertion   2. Nonrheumatic aortic valve stenosis   3. Coronary artery disease involving native coronary artery of native heart, unspecified whether angina present   4. Bilateral leg edema   5. Tobacco use   6. Hypertension, unspecified type   7. Diabetes mellitus without complication (HCC)    PLAN:    Dyspnea on exertion is likely multifactorial she is a long-term smoker with COPD I cannot refer her to pulmonary for optimizing her medication here.    In addition she has an echocardiogram moderate aortic stenosis (on her coronary CTA 2127 calcium score of her aortic valve suggesting severe aortic stenosis) I am going to refer her to our structural valve team for further evaluation here.  In addition she has bilateral leg edema she is supposed to be on 20 mg daily of Lasix she has  not been taking this medication I am going to send refills to the pharmacy of asked the patient to please take her Lasix once a day with potassium supplements.  I also discussed the result for coronary CTA which showed moderate coronary artery disease.  She is already on Crestor 10 mg daily and will increase that to 20 mg daily.  And add aspirin 81 mg to her medication regimen.  Smoking cessation advised.   The patient is in agreement with the above plan. The patient left the office in stable condition.  The patient will follow up in 4 months or sooner if needed.    Medication Adjustments/Labs and Tests Ordered: Current medicines are reviewed at length with the patient today.  Concerns regarding medicines are outlined above.  Orders Placed This Encounter  Procedures  . Basic metabolic panel  . Magnesium  . Ambulatory referral to Structural Heart/Valve Clinic (only at [facility])  . Ambulatory referral to Pulmonology   Meds ordered this encounter  Medications  . furosemide (LASIX) 20 MG tablet    Sig: Take 1 tablet (20 mg total) by mouth daily.    Dispense:  90 tablet    Refill:  3  . potassium chloride (KLOR-CON) 10 MEQ tablet    Sig: Take 1 tablet (10 mEq total) by mouth daily.    Dispense:  90 tablet    Refill:  3    Patient Instructions  Medication Instructions:  Your physician has recommended you make the following change in your medication: START: Lasix 20 mg once daily START: Potassium 10 meq once daily *If you need a refill on your cardiac medications before your next appointment, please call your pharmacy*   Lab Work: Your physician recommends that you return for lab work: TODAY: BMET, [place] If you have labs (blood work) drawn today and your tests are completely normal, you will receive your results only by: Marland Kitchen MyChart Message (if you have MyChart) OR . A paper copy in the mail If you have any lab test that is abnormal or we need to change your treatment, we will call  you to review the results.  Testing/Procedures: None   Follow-Up: At [facility], you and your health needs are our priority.  As part of our continuing mission to provide you with exceptional heart care, we have created designated Provider Care Teams.  These Care Teams include your primary Cardiologist (physician) and Advanced Practice Providers (APPs -  Physician Assistants and Nurse Practitioners) who all work together to provide you with the care you need, when you need it.  We recommend signing up for the patient portal called "MyChart".  Sign up information is provided on this After Visit Summary.  MyChart is used to connect with patients for Virtual Visits (Telemedicine).  Patients are able to view lab/test results, encounter notes, upcoming appointments, etc.  Non-urgent messages can be sent to your provider as well.   To learn more about what you can do with MyChart, go to [email or web address].    Your next appointment:   4 month(s)  The format for your next appointment:   In Person  Provider:   Berniece Salines, DO   Other Instructions      Adopting a Healthy Lifestyle.  Know what a healthy weight is for you (roughly BMI <25) and aim to maintain this   Aim for 7+ servings of fruits and vegetables daily   65-80+ fluid ounces of water or unsweet tea for healthy kidneys   Limit to max 1 drink of alcohol per day; avoid smoking/tobacco   Limit animal fats in diet for cholesterol and heart health - choose grass fed whenever available   Avoid highly processed foods, and foods high in saturated/trans fats   Aim for low stress - take time to unwind and care for your mental health   Aim for 150 min of moderate intensity exercise weekly for heart health, and weights twice weekly for bone health   Aim for 7-9 hours of sleep daily   When it comes to diets, agreement about the perfect plan isnt easy to find, even among the experts. Experts at the [employer] developed an idea known as the Healthy Eating Plate. Just imagine a plate divided into logical, healthy portions.   The emphasis is on diet quality:   Load up on vegetables and fruits - one-half of your plate: Aim for color and variety, and remember that potatoes dont count.   Go for whole grains - one-quarter of your plate: Whole wheat, barley, wheat berries, quinoa, oats, brown rice, and foods made with them. If you want pasta, go with whole wheat pasta.   Protein power - one-quarter of your plate: Fish, chicken, beans, and nuts are all healthy, versatile protein sources. Limit red meat.   The diet, however, does go beyond the plate, offering a few other suggestions.   Use healthy plant oils, such as olive, canola, soy, corn, sunflower and peanut. Check the labels, and avoid partially hydrogenated oil, which have unhealthy trans fats.   If youre thirsty, drink water. Coffee and tea are good in moderation, but skip sugary drinks and limit milk and dairy products to one or two daily servings.   The type of carbohydrate in the diet is more important than the amount. Some sources of carbohydrates, such as vegetables, fruits, whole grains, and beans-are healthier than others.   Finally, stay active  Signed, Berniece Salines, DO  10/07/2020 5:10 PM    [facility]

## 2020-10-08 ENCOUNTER — Other Ambulatory Visit: Payer: Self-pay | Admitting: *Deleted

## 2020-10-08 DIAGNOSIS — I2510 Atherosclerotic heart disease of native coronary artery without angina pectoris: Secondary | ICD-10-CM | POA: Diagnosis not present

## 2020-10-08 DIAGNOSIS — I350 Nonrheumatic aortic (valve) stenosis: Secondary | ICD-10-CM | POA: Diagnosis not present

## 2020-10-08 DIAGNOSIS — R600 Localized edema: Secondary | ICD-10-CM | POA: Diagnosis not present

## 2020-10-08 NOTE — Patient Outreach (Signed)
[facility]) Care Management  10/08/2020  Angel Encina Laelle Dunn April 15, 1966 [id number]   Outgoing call placed to member, state she is "ok."  Still has [facility] for PCS, does not recall [facility] calling her back to process change request to [facility].  Conference call placed to [place], they are unable to look into the request as their system is currently down.  They request call back in about 1-2 hours.  Member was seen by cardiology yesterday, new medications prescribed due to extremity swelling.  Prescriptions sent to [facility], will have pill packaging updated.  Will see PCP on Friday.  Reports she has decreases smoking, 1 cigarette yesterday, none today.   Update @1500 :    Call placed back to member to complete request with [facility], no answer, unable to leave voice message.  Will follow up within the next week.  Goals Addressed            This Visit's Progress   . Find Help in My Community   On track    Timeframe:  Short-Term Goal Priority:  High Start Date:         5/4                Expected End Date: 6/4                     - follow-up on any referrals for help I am given - think ahead to make sure my need does not become an emergency    Why is this important?    Knowing how and where to find help for yourself or family in your neighborhood and community is an important skill.   You will want to take some steps to learn how.    Notes:   4/7 - Call placed to PCP office to request order to increase hours for in home aides  5/4 - Conference call placed to [employer] as member is requesting to change PCS provider from [facility] to [facility]    . [place] My Quality of Life   On track    Timeframe:  Long-Range Goal Priority:  Medium Start Date:        3/9                     Expected End Date:   6/9                       - check out options for in-home help, long-term care or hospice - discuss my treatment  options with the doctor or nurse - make shared treatment decisions with doctor    Why is this important?    Having a long-term illness can be scary.   It can also be stressful for you and your caregiver.   These steps may help.    Notes:   4/7 - Discussed long term plan to continue living independently  5/4 - Encouraged to reconsider other alternatives in the community (adult day care, PACE, ALF, etc.)      [name], RN, MSN [place] [phone]

## 2020-10-09 ENCOUNTER — Other Ambulatory Visit: Payer: Self-pay | Admitting: *Deleted

## 2020-10-09 LAB — MAGNESIUM: Magnesium: 2.2 mg/dL (ref 1.6–2.3)

## 2020-10-09 LAB — BASIC METABOLIC PANEL
BUN/Creatinine Ratio: 16 (ref 9–23)
BUN: 13 mg/dL (ref 6–24)
CO2: 20 mmol/L (ref 20–29)
Calcium: 8.6 mg/dL — ABNORMAL LOW (ref 8.7–10.2)
Chloride: 106 mmol/L (ref 96–106)
Creatinine, Ser: 0.8 mg/dL (ref 0.57–1.00)
Glucose: 104 mg/dL — ABNORMAL HIGH (ref 65–99)
Potassium: 4.3 mmol/L (ref 3.5–5.2)
Sodium: 143 mmol/L (ref 134–144)
eGFR: 88 mL/min/{1.73_m2} (ref >59)

## 2020-10-09 NOTE — Patient Outreach (Signed)
[facility]) Care Management  10/09/2020  Ammanda Dobbins Mahreen Schewe 06/24/1965 [id number]   Voice message received from member requesting call back with complaints of aide through [facility].  Call placed back to member, report she is still having problems and didn't have an aide today.  Conference call placed to [employer], notified that someone from the number [phone] called on 4/28 stating they were the member and wanted to stay with [facility].  Member verified that this is not her phone number and she indeed would like to change providers.  [facility] will call member directly once this call has ended to proceed with change request (unable to complete on 3-way call).  This care manager will follow up with member within the next 2 weeks.    Call placed to [phone] to inquire about call to [facility], no answer.    [name], [place], MSN [place] [phone]

## 2020-10-10 DIAGNOSIS — H9193 Unspecified hearing loss, bilateral: Secondary | ICD-10-CM | POA: Diagnosis not present

## 2020-10-10 DIAGNOSIS — I350 Nonrheumatic aortic (valve) stenosis: Secondary | ICD-10-CM | POA: Diagnosis not present

## 2020-10-10 DIAGNOSIS — K120 Recurrent oral aphthae: Secondary | ICD-10-CM | POA: Diagnosis not present

## 2020-10-10 DIAGNOSIS — Z681 Body mass index (BMI) 19 or less, adult: Secondary | ICD-10-CM | POA: Diagnosis not present

## 2020-10-21 ENCOUNTER — Other Ambulatory Visit: Payer: Self-pay | Admitting: *Deleted

## 2020-10-21 NOTE — Patient Outreach (Signed)
[place] [facility]) Care Management  10/21/2020  Taffy Delconte Sreenidhi Ganson May 11, 1966 [id number]   Outgoing call placed to member, state she had a fall a few days ago but otherwise is doing well.  Confirmed she was able to receive updated medication package from [facility].  Denies any urgent concerns, encouraged to contact this care manager with questions.  Agrees to follow up within the next month.  Goals    . [facility] - Find Help in My Community     Timeframe:  Short-Term Goal Priority:  High Start Date:         5/4                Expected End Date: 6/4                  Barriers: Support System    - follow-up on any referrals for help I am given - think ahead to make sure my need does not become an emergency    Why is this important?    Knowing how and where to find help for yourself or family in your neighborhood and community is an important skill.   You will want to take some steps to learn how.    Notes:   4/7 - Call placed to PCP office to request order to increase hours for in home aides  5/4 - Conference call placed to [employer] as member is requesting to change PCS provider from [facility] to [facility]  5/17 - Confirmed with member that [facility] has started to provide aide services.  Encouraged to call agency to inquire about coverage for weekends as currently assigned aide does not work on weekends.  Advised to call family/friends to see if they can offer support over the weekend.     Margie Billet - [facility] My Quality of Life     Timeframe:  Long-Range Goal Priority:  Medium Start Date:        3/9                     Expected End Date:   6/9                    Barriers: Health Behaviors Psychosocial - Depression     - check out options for in-home help, long-term care or hospice - discuss my treatment options with the doctor or nurse - make shared treatment decisions with doctor    Why is this important?    Having a long-term  illness can be scary.   It can also be stressful for you and your caregiver.   These steps may help.    Notes:   4/7 - Discussed long term plan to continue living independently  5/4 - Encouraged to reconsider other alternatives in the community (adult day care, PACE, ALF, etc.)  5/17 - Advised member to call PCP office to discuss concerns regarding current antidepressant making her very sleepy and "mean"       [name], Therapist, sports, MSN [place] Manager [phone]

## 2020-10-31 ENCOUNTER — Institutional Professional Consult (permissible substitution): Payer: Medicare Other | Admitting: Pulmonary Disease

## 2020-11-03 DIAGNOSIS — N6019 Diffuse cystic mastopathy of unspecified breast: Secondary | ICD-10-CM | POA: Diagnosis not present

## 2020-11-03 DIAGNOSIS — D649 Anemia, unspecified: Secondary | ICD-10-CM | POA: Diagnosis not present

## 2020-11-06 ENCOUNTER — Ambulatory Visit (INDEPENDENT_AMBULATORY_CARE_PROVIDER_SITE_OTHER): Payer: Medicare Other | Admitting: Cardiovascular Disease

## 2020-11-06 ENCOUNTER — Encounter: Payer: Self-pay | Admitting: Cardiovascular Disease

## 2020-11-06 ENCOUNTER — Other Ambulatory Visit: Payer: Self-pay

## 2020-11-06 VITALS — BP 120/80 | HR 68 | Ht 69.0 in | Wt 118.8 lb

## 2020-11-06 DIAGNOSIS — I350 Nonrheumatic aortic (valve) stenosis: Secondary | ICD-10-CM

## 2020-11-06 NOTE — Progress Notes (Signed)
Cardiology Office Note:    Date:  11/09/2020   ID:  [name] [name], DOB [birth year], MRN [id number]  PCP:  Serita Grammes, MD   [place] [facility] Cardiologist:  Berniece Salines, DO     Referring MD: Serita Grammes, MD   Chief Complaint  Patient presents with  . Shortness of Breath    History of Present Illness:    [name] is a 55 y.o. female presenting for evaluation of aortic stenosis, referred by Dr Harriet Masson. The patient is here with her husband today. She states she has had a heart murmur 'all of my life.' She was told many years ago that something was wrong with her aortic valve, but she doesn't remember any details.   The patient complains of shortness of breath. She gets lightheaded and dizzy with this. She is short of breath with minimal activity. She's been on home oxygen in the past but not presently. She has a history of COPD and has been a longtime smoker. She started smoking at age 3 and smoked 1 ppd x 20 years then up to 2 ppd. She has a lot of coughing when she lies down and feels like she can't catch her breath. She also complains of left sided chest pain that is located down around the left ribcage area. Other complaints include generalized weakness and near-syncope.   Past Medical History:  Diagnosis Date  . Anemia   . Anxiety   . Arthritis   . Asthma   . Bacteremia 06/30/2013  . Breast infection 03/21/2012  . CHF (congestive heart failure) ([place])    hx CHF 1 year ago in [facility]  . Confusion 06/25/2013  . COPD (chronic obstructive pulmonary disease) ([place])   . COPD exacerbation ([place]) 06/26/2013  . Cough with congestion of paranasal sinus   . Depression   . Diabetes mellitus without complication ([place])    on meds  . Fibromyalgia 03/31/2012  . GERD (gastroesophageal reflux disease)   . Heart murmur   . Hemophilus influenzae (H. influenzae) infection in conditions classified elsewhere and of unspecified site 06/30/2013   . History of MRSA infection 03/31/2012   Formatting of this note might be different from the original. Rt arm 02/2012 per pt in setting of spider bite  . Hyperlipidemia 06/25/2013  . Hypertension 03/31/2012  . Insomnia 06/25/2013  . Mastitis chronic 03/06/2012  . Neuropathy 06/25/2013  . Pneumonia 2015  . Preop examination 03/15/2016   Formatting of this note might be different from the original. Added automatically from request for surgery [phone]  . PTSD (post-traumatic stress disorder) 06/25/2013  . Respiratory failure with hypoxia ([place]) 06/26/2013  . Shoulder pain 07/01/2013  . Sinus congestion   . Tobacco abuse 06/25/2013  . Weakness 06/25/2013    Past Surgical History:  Procedure Laterality Date  . ABDOMINAL HYSTERECTOMY  1998   tubal pregnancy right side partial hysterectomy  . APPENDECTOMY    . BREAST SURGERY Bilateral    chronic mastitis  . CATARACT EXTRACTION W/ INTRAOCULAR LENS  IMPLANT, BILATERAL    . MULTIPLE EXTRACTIONS WITH ALVEOLOPLASTY N/A 08/09/2014   Procedure: MULTIPLE EXTRACTIONS WITH ALVEOLOPLASTY ;  Surgeon: Diona Browner, DDS;  Location: [facility];  Service: Oral Surgery;  Laterality: N/A;    Current Medications: Current Meds  Medication Sig  . albuterol (VENTOLIN HFA) 108 (90 Base) MCG/ACT inhaler Inhale 1-2 puffs into the lungs daily. 20 minutes prior to walking a long distance  . aspirin EC 81 MG tablet  Take 1 tablet (81 mg total) by mouth daily. Swallow whole.  Marland Kitchen azelastine (ASTELIN) 0.1 % nasal spray Place into both nostrils 2 (two) times daily. Use in each nostril as directed Patient states she will start medication 07/23/2020.  Marland Kitchen doxycycline (VIBRAMYCIN) 100 MG capsule Take 100 mg by mouth 2 (two) times daily.  Marland Kitchen escitalopram (LEXAPRO) 10 MG tablet Take 10 mg by mouth daily. Patient states she will start medication 07/23/2020.  . fluticasone (FLONASE) 50 MCG/ACT nasal spray Place 2 sprays into both nostrils daily.  . furosemide (LASIX) 20 MG tablet Take 1 tablet  (20 mg total) by mouth daily.  Marland Kitchen levocetirizine (XYZAL) 5 MG tablet Take 5 mg by mouth every evening. Patient states she will start medication 07/23/2020.  Marland Kitchen LORazepam (ATIVAN) 1 MG tablet Take with you to scan  . montelukast (SINGULAIR) 10 MG tablet Take 10 mg by mouth at bedtime. Patient states she will start medication 07/23/2020.  . nitrofurantoin (MACRODANTIN) 100 MG capsule Take 100 mg by mouth 2 (two) times daily.  Marland Kitchen nystatin (MYCOSTATIN) 100000 UNIT/ML suspension Take 5 mLs by mouth 3 (three) times daily.  . pantoprazole (PROTONIX) 40 MG tablet Take 40 mg by mouth daily. Patient states she will start medication 07/23/2020.  Marland Kitchen potassium chloride (KLOR-CON) 10 MEQ tablet Take 1 tablet (10 mEq total) by mouth daily.  . promethazine-dextromethorphan (PROMETHAZINE-DM) 6.25-15 MG/5ML syrup Take 5 mLs by mouth at bedtime as needed.  . rosuvastatin (CRESTOR) 20 MG tablet Take 1 tablet (20 mg total) by mouth daily.  . valACYclovir (VALTREX) 1000 MG tablet Take 1,000 mg by mouth 2 (two) times daily.     Allergies:   Clarithromycin, Penicillins, Wound dressing adhesive, Other, Propoxyphene, Sulfamethoxazole, Chocolate, Percocet [oxycodone-acetaminophen], Ranitidine, Sulfa antibiotics, Tape, Coconut fatty acids, Codeine, and Oxycodone-acetaminophen   Social History   Socioeconomic History  . Marital status: Married    Spouse name: Not on file  . Number of children: Not on file  . Years of education: Not on file  . Highest education level: Not on file  Occupational History  . Not on file  Tobacco Use  . Smoking status: Current Every Day Smoker    Packs/day: 0.25    Years: 32.00    Pack years: 8.00    Types: Cigarettes  . Smokeless tobacco: Never Used  Substance and Sexual Activity  . Alcohol use: No  . Drug use: No  . Sexual activity: Not on file  Other Topics Concern  . Not on file  Social History Narrative  . Not on file   Social Determinants of Health   Financial Resource  Strain: Not on file  Food Insecurity: Food Insecurity Present  . Worried About Charity fundraiser in the Last Year: Sometimes true  . Ran Out of Food in the Last Year: Sometimes true  Transportation Needs: Unmet Transportation Needs  . Lack of Transportation (Medical): Yes  . Lack of Transportation (Non-Medical): Yes  Physical Activity: Not on file  Stress: Not on file  Social Connections: Not on file     Family History: The patient's family history includes Hypertension in her father and mother; Skin cancer in her maternal grandmother and mother; Stroke in her mother.  ROS:   Please see the history of present illness.    All other systems reviewed and are negative.  EKGs/Labs/Other Studies Reviewed:    The following studies were reviewed today: Echo 06/04/2020: IMPRESSIONS    1. Left ventricular ejection fraction, by estimation, is 60  to 65%. The  left ventricle has normal function. The left ventricle has no regional  wall motion abnormalities. Left ventricular diastolic parameters are  consistent with Grade I diastolic  dysfunction (impaired relaxation).  2. Right ventricular systolic function is normal. The right ventricular  size is normal. There is normal pulmonary artery systolic pressure.  3. The mitral valve is normal in structure. No evidence of mitral valve  regurgitation. No evidence of mitral stenosis.  4. The aortic valve is calcified. There is moderate calcification of the  aortic valve. There is moderate thickening of the aortic valve. Aortic  valve regurgitation is mild. Moderate aortic valve stenosis. Aortic valve  area, by VTI measures 1.29 cm.  Aortic valve mean gradient measures 30.0 mmHg. Aortic valve Vmax measures  3.66 m/s.  5. The inferior vena cava is normal in size with greater than 50%  respiratory variability, suggesting right atrial pressure of 3 mmHg.   Conclusion(s)/Recommendation(s): Moderate aortic stenosis.  LEFT VENTRICLE  PLAX  2D  LVIDd:     4.00 cm   Diastology  LVIDs:     2.60 cm   LV e' medial:  5.27 cm/s  LV PW:     1.00 cm   LV E/e' medial: 16.6  LV IVS:    1.00 cm   LV e' lateral:  6.97 cm/s  LVOT diam:   2.00 cm   LV E/e' lateral: 12.6  LV SV:     101  LV SV Index:  59  LVOT Area:   3.14 cm    LV Volumes (MOD)  LV vol d, MOD A2C: 84.9 ml  LV vol d, MOD A4C: 79.4 ml  LV vol s, MOD A2C: 32.1 ml  LV vol s, MOD A4C: 31.5 ml  LV SV MOD A2C:   52.8 ml  LV SV MOD A4C:   79.4 ml  LV SV MOD BP:   50.2 ml   RIGHT VENTRICLE  RV S prime:   13.40 cm/s  TAPSE (M-mode): 2.0 cm   LEFT ATRIUM       Index    RIGHT ATRIUM      Index  LA diam:    3.80 cm 2.24 cm/m RA Area:   11.10 cm  LA Vol (A2C):  25.8 ml 15.21 ml/m RA Volume:  21.50 ml 12.67 ml/m  LA Vol (A4C):  37.0 ml 21.81 ml/m  LA Biplane Vol: 34.3 ml 20.22 ml/m  AORTIC VALVE  AV Area (Vmax):  1.34 cm  AV Area (Vmean):  1.37 cm  AV Area (VTI):   1.29 cm  AV Vmax:      366.00 cm/s  AV Vmean:     259.000 cm/s  AV VTI:      0.778 m  AV Peak Grad:   53.6 mmHg  AV Mean Grad:   30.0 mmHg  LVOT Vmax:     156.00 cm/s  LVOT Vmean:    113.000 cm/s  LVOT VTI:     0.320 m  LVOT/AV VTI ratio: 0.41    AORTA  Ao Root diam: 3.30 cm   MITRAL VALVE        TRICUSPID VALVE  MV Area (PHT): 2.96 cm   TR Peak grad:  22.1 mmHg  MV Decel Time: 256 msec   TR Vmax:    235.00 cm/s  MV E velocity: 87.50 cm/s  MV A velocity: 122.00 cm/s SHUNTS  MV E/A ratio: 0.72     Systemic VTI: 0.32 m  Systemic Diam: 2.00 cm   Coronary CTA: Aorta: Normal size.  Mild calcifications.  No dissection.  Aortic Valve: Trileaflet. Moderate calcifications. Aortic valve calcium score 2127.  Coronary calcium score 587  Coronary Arteries:  Normal coronary origin.  Left  dominance.  RCA is a small  non-dominant artery.  There is no plaque.  Left main is a large artery that gives rise to LAD and LCX arteries. There is a mild calcified plaque in the ostium of the left main.  LAD is a large vessel. The proximal LAD with a mild (25-49%) calcified plaque. The mid LAD with multiple mild diffuse calcified plaques. The mid to distal LAD with no plaques.  LCX is a large dominant artery that gives rise to one large OM1 branch that gives rise to PDA. There is a lesion in the mid LCX which starts off as a mild calcified lesion and terminates to a moderate calcified lesion. The distal LCX with minimal calcified plaques.  Other findings:  Normal pulmonary vein drainage into the left atrium.  Normal left atrial appendage without a thrombus.  Normal size of the pulmonary artery.  IMPRESSION: 1. Coronary calcium score of 587. This was 63 percentile for age and sex matched control.  2. Normal coronary origin with left dominance.  3. Moderate CAD.  CADRADS 3.  This study will be sent for FFRct.  4. Aortic valve calcium score 2127, suggesting Severe Aortic valve stenosis.  5. Aortic atherosclerosis.  CT-FFR: 1. Left Main: 0.99  2. LAD: Proximal 0.97, Mid 0.97, Distal 0.91  3. LCX: Proximal 0.99, Mid 0.95, Distal 0.86  4. RCA: Proximal 0.97, Mid 0.94, Distal 0.89  IMPRESSION: There is no flow limiting lesions as described above on FFRct.  Recent Labs: 05/29/2020: BNP 150.7; Hemoglobin 13.6; Platelets 93 10/08/2020: BUN 13; Creatinine, Ser 0.80; Magnesium 2.2; Potassium 4.3; Sodium 143  Recent Lipid Panel No results found for: CHOL, TRIG, HDL, CHOLHDL, VLDL, LDLCALC, LDLDIRECT   Risk Assessment/Calculations:       Physical Exam:    VS:  BP 120/80   Pulse 68   Ht 5\' 9"  (1.753 m)   Wt 118 lb 12.8 oz (53.9 kg)   SpO2 92%   BMI 17.54 kg/m     Wt Readings from Last 3 Encounters:  11/06/20 118 lb 12.8 oz (53.9 kg)  10/07/20 123 lb (55.8 kg)  07/09/20  123 lb 12.8 oz (56.2 kg)     GEN:  Chronically ill-appearing woman in no acute distress HEENT: Normal NECK: No JVD; BL carotid bruits LYMPHATICS: No lymphadenopathy CARDIAC: RRR, 3/6 harsh crescendo decrescendo murmur at the RUSB, diminished A2 RESPIRATORY:  Clear to auscultation without rales, wheezing or rhonchi  ABDOMEN: Soft, non-tender, non-distended MUSCULOSKELETAL:  No edema; No deformity  SKIN: Warm and dry NEUROLOGIC:  Alert and oriented x 3 PSYCHIATRIC:  Normal affect   ASSESSMENT:    1. Nonrheumatic aortic valve stenosis    PLAN:    In order of problems listed above:  1. Symptomatic aortic stenosis: 55 yo woman with hx of heavy tobacco use presents with progressive symptoms of exertional dyspnea, orthopnea, lightheadedness, and atypical chest pain.   I have personally reviewed the patient's echo images.  She has vigorous LV systolic function.  The aortic valve is moderately calcified and restricted.  Peak transaortic velocity is 3.7 m/s with peak and mean gradients of 54 and 30 mmHg, respectively.  The dimensionless index is 0.43.  Calculated aortic valve area is 1.2 to 1.3 cm.  These findings are consistent with moderate aortic stenosis.  Her CT scan is also reviewed with a high aortic valve calcium score of 2127 possibly consistent with severe aortic stenosis.  The aortic valve is trileaflet.  There is no high-grade coronary obstructive disease based on the gated coronary CTA images and CT-FFR data. Considering her progressive symptoms and discordant exam/non-invasive findings (exam and CT suggest severe AS, echo suggests moderate AS), I favor invasive evaluation with R/L heart catheterization for definitive evaluation. I have reviewed the risks, indications, and alternatives to cardiac catheterization, possible angioplasty, and stenting with the patient. Risks include but are not limited to bleeding, infection, vascular injury, stroke, myocardial infection, arrhythmia, kidney  injury, radiation-related injury in the case of prolonged fluoroscopy use, emergency cardiac surgery, and death. The patient understands the risks of serious complication is 1-2 in 7096 with diagnostic cardiac cath and 1-2% or less with angioplasty/stenting.   Shared Decision Making/Informed Consent The risks [stroke (1 in 1000), death (1 in 1000), kidney failure [usually temporary] (1 in 500), bleeding (1 in 200), allergic reaction [possibly serious] (1 in 200)], benefits (diagnostic support and management of coronary artery disease) and alternatives of a cardiac catheterization were discussed in detail with Ms. Willard and she is willing to proceed.  Medication Adjustments/Labs and Tests Ordered: Current medicines are reviewed at length with the patient today.  Concerns regarding medicines are outlined above.  Orders Placed This Encounter  Procedures  . Basic metabolic panel  . CBC with Differential/Platelet   No orders of the defined types were placed in this encounter.   Patient Instructions   CATHETERIZATION INSTRUCTIONS: You are scheduled for a Cardiac Catheterization on Friday, June 10 with Dr. Sherren Mocha.  1. Please arrive at the [facility] (Main Entrance A) at [facility]: [address] [place], [place] [address] at 6:30 AM (This time is two hours before your procedure to ensure your preparation). Free valet parking service is available. You are allowed ONE visitor in the waiting room during your procedure. Both you and your guest must wear masks. Special note: Every effort is made to have your procedure done on time. Please understand that emergencies sometimes delay scheduled procedures.  2. Diet: Do not eat solid foods after midnight.  You may have clear liquids until 5am upon the day of the procedure.  3. Labs: Within 1 week.  4. Medication instructions in preparation for your procedure:  1) HOLD LASIX the morning of your cath  2) HOLD KDUR the morning of  your cath  3) Make sure to take your ASPIRIN the morning of your cath  4) You may take your other meds as directed with sips of water   5. Plan for one night stay--bring personal belongings. 6. Bring a current list of your medications and current insurance cards. 7. You MUST have a responsible person to drive you home. 8. Someone MUST be with you the first 24 hours after you arrive home or your discharge will be delayed. 9. Please wear clothes that are easy to get on and off and wear slip-on shoes.  Thank you for allowing [place] to care for you!   -- [facility]     Heart-Healthy Eating Plan Heart-healthy meal planning includes:  Eating less unhealthy fats.  Eating more healthy fats.  Making other changes in your diet. Talk with your doctor or a diet specialist (dietitian) to create an eating plan that is right for you.  What are tips  for following this plan? Cooking Avoid frying your food. Try to bake, boil, grill, or broil it instead. You can also reduce fat by:  Removing the skin from poultry.  Removing all visible fats from meats.  Steaming vegetables in water or broth. Meal planning  At meals, divide your plate into four equal parts: ? Fill one-half of your plate with vegetables and green salads. ? Fill one-fourth of your plate with whole grains. ? Fill one-fourth of your plate with lean protein foods.  Eat 4-5 servings of vegetables per day. A serving of vegetables is: ? 1 cup of raw or cooked vegetables. ? 2 cups of raw leafy greens.  Eat 4-5 servings of fruit per day. A serving of fruit is: ? 1 medium whole fruit. ?  cup of dried fruit. ?  cup of fresh, frozen, or canned fruit. ?  cup of 100% fruit juice.  Eat more foods that have soluble fiber. These are apples, broccoli, carrots, beans, peas, and barley. Try to get 20-30 g of fiber per day.  Eat 4-5 servings of nuts, legumes, and seeds per week: ? 1 serving of dried beans  or legumes equals  cup after being cooked. ? 1 serving of nuts is  cup. ? 1 serving of seeds equals 1 tablespoon.   General information  Eat more home-cooked food. Eat less restaurant, buffet, and fast food.  Limit or avoid alcohol.  Limit foods that are high in starch and sugar.  Avoid fried foods.  Lose weight if you are overweight.  Keep track of how much salt (sodium) you eat. This is important if you have high blood pressure. Ask your doctor to tell you more about this.  Try to add vegetarian meals each week. Fats  Choose healthy fats. These include olive oil and canola oil, flaxseeds, walnuts, almonds, and seeds.  Eat more omega-3 fats. These include salmon, mackerel, sardines, tuna, flaxseed oil, and ground flaxseeds. Try to eat fish at least 2 times each week.  Check food labels. Avoid foods with trans fats or high amounts of saturated fat.  Limit saturated fats. ? These are often found in animal products, such as meats, butter, and cream. ? These are also found in plant foods, such as palm oil, palm kernel oil, and coconut oil.  Avoid foods with partially hydrogenated oils in them. These have trans fats. Examples are stick margarine, some tub margarines, cookies, crackers, and other baked goods. What foods can I eat? Fruits All fresh, canned (in natural juice), or frozen fruits. Vegetables Fresh or frozen vegetables (raw, steamed, roasted, or grilled). Green salads. Grains Most grains. Choose whole wheat and whole grains most of the time. Rice and pasta, including brown rice and pastas made with whole wheat. Meats and other proteins Lean, well-trimmed beef, veal, pork, and lamb. Chicken and [place] without skin. All fish and shellfish. Wild duck, rabbit, pheasant, and venison. Egg whites or low-cholesterol egg substitutes. Dried beans, peas, lentils, and tofu. Seeds and most nuts. Dairy Low-fat or nonfat cheeses, including ricotta and mozzarella. Skim or 1% milk  that is liquid, powdered, or evaporated. Buttermilk that is made with low-fat milk. Nonfat or low-fat yogurt. Fats and oils Non-hydrogenated (trans-free) margarines. Vegetable oils, including soybean, sesame, sunflower, olive, peanut, safflower, corn, canola, and cottonseed. Salad dressings or mayonnaise made with a vegetable oil. Beverages Mineral water. Coffee and tea. Diet carbonated beverages. Sweets and desserts Sherbet, gelatin, and fruit ice. Small amounts of dark chocolate. Limit all sweets and desserts.  Seasonings and condiments All seasonings and condiments. The items listed above may not be a complete list of foods and drinks you can eat. Contact a dietitian for more options. What foods should I avoid? Fruits Canned fruit in heavy syrup. Fruit in cream or butter sauce. Fried fruit. Limit coconut. Vegetables Vegetables cooked in cheese, cream, or butter sauce. Fried vegetables. Grains Breads that are made with saturated or trans fats, oils, or whole milk. Croissants. Sweet rolls. Donuts. High-fat crackers, such as cheese crackers. Meats and other proteins Fatty meats, such as hot dogs, ribs, sausage, bacon, rib-eye roast or steak. High-fat deli meats, such as salami and bologna. Caviar. Domestic duck and goose. Organ meats, such as liver. Dairy Cream, sour cream, cream cheese, and creamed cottage cheese. Whole-milk cheeses. Whole or 2% milk that is liquid, evaporated, or condensed. Whole buttermilk. Cream sauce or high-fat cheese sauce. Yogurt that is made from whole milk. Fats and oils Meat fat, or shortening. Cocoa butter, hydrogenated oils, palm oil, coconut oil, palm kernel oil. Solid fats and shortenings, including bacon fat, salt pork, lard, and butter. Nondairy cream substitutes. Salad dressings with cheese or sour cream. Beverages Regular sodas and juice drinks with added sugar. Sweets and desserts Frosting. Pudding. Cookies. Cakes. Pies. Milk chocolate or white  chocolate. Buttered syrups. Full-fat ice cream or ice cream drinks. The items listed above may not be a complete list of foods and drinks to avoid. Contact a dietitian for more information. Summary  Heart-healthy meal planning includes eating less unhealthy fats, eating more healthy fats, and making other changes in your diet.  Eat a balanced diet. This includes fruits and vegetables, low-fat or nonfat dairy, lean protein, nuts and legumes, whole grains, and heart-healthy oils and fats. This information is not intended to replace advice given to you by your health care provider. Make sure you discuss any questions you have with your health care provider. Document Revised: 07/28/2017 Document Reviewed: 07/01/2017 Elsevier Patient Education  2021 [place], Sherren Mocha, MD  11/09/2020 9:42 PM    [place]

## 2020-11-06 NOTE — H&P (View-Only) (Signed)
Cardiology Office Note:    Date:  11/09/2020   ID:  [name] Neta Upadhyay, DOB 07-30-1965, MRN [id number]  PCP:  Serita Grammes, MD   [facility] Cardiologist:  Berniece Salines, DO     Referring MD: Serita Grammes, MD   Chief Complaint  Patient presents with  . Shortness of Breath    History of Present Illness:    [name] is a 55 y.o. female presenting for evaluation of aortic stenosis, referred by Dr Harriet Masson. The patient is here with her husband today. She states she has had a heart murmur 'all of my life.' She was told many years ago that something was wrong with her aortic valve, but she doesn't remember any details.   The patient complains of shortness of breath. She gets lightheaded and dizzy with this. She is short of breath with minimal activity. She's been on home oxygen in the past but not presently. She has a history of COPD and has been a longtime smoker. She started smoking at age 51 and smoked 1 ppd x 20 years then up to 2 ppd. She has a lot of coughing when she lies down and feels like she can't catch her breath. She also complains of left sided chest pain that is located down around the left ribcage area. Other complaints include generalized weakness and near-syncope.   Past Medical History:  Diagnosis Date  . Anemia   . Anxiety   . Arthritis   . Asthma   . Bacteremia 06/30/2013  . Breast infection 03/21/2012  . CHF (congestive heart failure) ([place])    hx CHF 1 year ago in [facility]  . Confusion 06/25/2013  . COPD (chronic obstructive pulmonary disease) ([place])   . COPD exacerbation ([place]) 06/26/2013  . Cough with congestion of paranasal sinus   . Depression   . Diabetes mellitus without complication ([place])    on meds  . Fibromyalgia 03/31/2012  . GERD (gastroesophageal reflux disease)   . Heart murmur   . Hemophilus influenzae (H. influenzae) infection in conditions classified elsewhere and of unspecified site 06/30/2013   . History of MRSA infection 03/31/2012   Formatting of this note might be different from the original. Rt arm 02/2012 per pt in setting of spider bite  . Hyperlipidemia 06/25/2013  . Hypertension 03/31/2012  . Insomnia 06/25/2013  . Mastitis chronic 03/06/2012  . Neuropathy 06/25/2013  . Pneumonia 2015  . Preop examination 03/15/2016   Formatting of this note might be different from the original. Added automatically from request for surgery [phone]  . PTSD (post-traumatic stress disorder) 06/25/2013  . Respiratory failure with hypoxia ([place]) 06/26/2013  . Shoulder pain 07/01/2013  . Sinus congestion   . Tobacco abuse 06/25/2013  . Weakness 06/25/2013    Past Surgical History:  Procedure Laterality Date  . ABDOMINAL HYSTERECTOMY  1998   tubal pregnancy right side partial hysterectomy  . APPENDECTOMY    . BREAST SURGERY Bilateral    chronic mastitis  . CATARACT EXTRACTION W/ INTRAOCULAR LENS  IMPLANT, BILATERAL    . MULTIPLE EXTRACTIONS WITH ALVEOLOPLASTY N/A 08/09/2014   Procedure: MULTIPLE EXTRACTIONS WITH ALVEOLOPLASTY ;  Surgeon: Diona Browner, DDS;  Location: [facility];  Service: Oral Surgery;  Laterality: N/A;    Current Medications: Current Meds  Medication Sig  . albuterol (VENTOLIN HFA) 108 (90 Base) MCG/ACT inhaler Inhale 1-2 puffs into the lungs daily. 20 minutes prior to walking a long distance  . aspirin EC 81 MG tablet  Take 1 tablet (81 mg total) by mouth daily. Swallow whole.  Marland Kitchen azelastine (ASTELIN) 0.1 % nasal spray Place into both nostrils 2 (two) times daily. Use in each nostril as directed Patient states she will start medication 07/23/2020.  Marland Kitchen doxycycline (VIBRAMYCIN) 100 MG capsule Take 100 mg by mouth 2 (two) times daily.  Marland Kitchen escitalopram (LEXAPRO) 10 MG tablet Take 10 mg by mouth daily. Patient states she will start medication 07/23/2020.  . fluticasone (FLONASE) 50 MCG/ACT nasal spray Place 2 sprays into both nostrils daily.  . furosemide (LASIX) 20 MG tablet Take 1 tablet  (20 mg total) by mouth daily.  Marland Kitchen levocetirizine (XYZAL) 5 MG tablet Take 5 mg by mouth every evening. Patient states she will start medication 07/23/2020.  Marland Kitchen LORazepam (ATIVAN) 1 MG tablet Take with you to scan  . montelukast (SINGULAIR) 10 MG tablet Take 10 mg by mouth at bedtime. Patient states she will start medication 07/23/2020.  . nitrofurantoin (MACRODANTIN) 100 MG capsule Take 100 mg by mouth 2 (two) times daily.  Marland Kitchen nystatin (MYCOSTATIN) 100000 UNIT/ML suspension Take 5 mLs by mouth 3 (three) times daily.  . pantoprazole (PROTONIX) 40 MG tablet Take 40 mg by mouth daily. Patient states she will start medication 07/23/2020.  Marland Kitchen potassium chloride (KLOR-CON) 10 MEQ tablet Take 1 tablet (10 mEq total) by mouth daily.  . promethazine-dextromethorphan (PROMETHAZINE-DM) 6.25-15 MG/5ML syrup Take 5 mLs by mouth at bedtime as needed.  . rosuvastatin (CRESTOR) 20 MG tablet Take 1 tablet (20 mg total) by mouth daily.  . valACYclovir (VALTREX) 1000 MG tablet Take 1,000 mg by mouth 2 (two) times daily.     Allergies:   Clarithromycin, Penicillins, Wound dressing adhesive, Other, Propoxyphene, Sulfamethoxazole, Chocolate, Percocet [oxycodone-acetaminophen], Ranitidine, Sulfa antibiotics, Tape, Coconut fatty acids, Codeine, and Oxycodone-acetaminophen   Social History   Socioeconomic History  . Marital status: Married    Spouse name: Not on file  . Number of children: Not on file  . Years of education: Not on file  . Highest education level: Not on file  Occupational History  . Not on file  Tobacco Use  . Smoking status: Current Every Day Smoker    Packs/day: 0.25    Years: 32.00    Pack years: 8.00    Types: Cigarettes  . Smokeless tobacco: Never Used  Substance and Sexual Activity  . Alcohol use: No  . Drug use: No  . Sexual activity: Not on file  Other Topics Concern  . Not on file  Social History Narrative  . Not on file   Social Determinants of Health   Financial Resource  Strain: Not on file  Food Insecurity: Food Insecurity Present  . Worried About Charity fundraiser in the Last Year: Sometimes true  . Ran Out of Food in the Last Year: Sometimes true  Transportation Needs: Unmet Transportation Needs  . Lack of Transportation (Medical): Yes  . Lack of Transportation (Non-Medical): Yes  Physical Activity: Not on file  Stress: Not on file  Social Connections: Not on file     Family History: The patient's family history includes Hypertension in her father and mother; Skin cancer in her maternal grandmother and mother; Stroke in her mother.  ROS:   Please see the history of present illness.    All other systems reviewed and are negative.  EKGs/Labs/Other Studies Reviewed:    The following studies were reviewed today: Echo 06/04/2020: IMPRESSIONS    1. Left ventricular ejection fraction, by estimation, is 60  to 65%. The  left ventricle has normal function. The left ventricle has no regional  wall motion abnormalities. Left ventricular diastolic parameters are  consistent with Grade I diastolic  dysfunction (impaired relaxation).  2. Right ventricular systolic function is normal. The right ventricular  size is normal. There is normal pulmonary artery systolic pressure.  3. The mitral valve is normal in structure. No evidence of mitral valve  regurgitation. No evidence of mitral stenosis.  4. The aortic valve is calcified. There is moderate calcification of the  aortic valve. There is moderate thickening of the aortic valve. Aortic  valve regurgitation is mild. Moderate aortic valve stenosis. Aortic valve  area, by VTI measures 1.29 cm.  Aortic valve mean gradient measures 30.0 mmHg. Aortic valve Vmax measures  3.66 m/s.  5. The inferior vena cava is normal in size with greater than 50%  respiratory variability, suggesting right atrial pressure of 3 mmHg.   Conclusion(s)/Recommendation(s): Moderate aortic stenosis.  LEFT VENTRICLE  PLAX  2D  LVIDd:     4.00 cm   Diastology  LVIDs:     2.60 cm   LV e' medial:  5.27 cm/s  LV PW:     1.00 cm   LV E/e' medial: 16.6  LV IVS:    1.00 cm   LV e' lateral:  6.97 cm/s  LVOT diam:   2.00 cm   LV E/e' lateral: 12.6  LV SV:     101  LV SV Index:  59  LVOT Area:   3.14 cm    LV Volumes (MOD)  LV vol d, MOD A2C: 84.9 ml  LV vol d, MOD A4C: 79.4 ml  LV vol s, MOD A2C: 32.1 ml  LV vol s, MOD A4C: 31.5 ml  LV SV MOD A2C:   52.8 ml  LV SV MOD A4C:   79.4 ml  LV SV MOD BP:   50.2 ml   RIGHT VENTRICLE  RV S prime:   13.40 cm/s  TAPSE (M-mode): 2.0 cm   LEFT ATRIUM       Index    RIGHT ATRIUM      Index  LA diam:    3.80 cm 2.24 cm/m RA Area:   11.10 cm  LA Vol (A2C):  25.8 ml 15.21 ml/m RA Volume:  21.50 ml 12.67 ml/m  LA Vol (A4C):  37.0 ml 21.81 ml/m  LA Biplane Vol: 34.3 ml 20.22 ml/m  AORTIC VALVE  AV Area (Vmax):  1.34 cm  AV Area (Vmean):  1.37 cm  AV Area (VTI):   1.29 cm  AV Vmax:      366.00 cm/s  AV Vmean:     259.000 cm/s  AV VTI:      0.778 m  AV Peak Grad:   53.6 mmHg  AV Mean Grad:   30.0 mmHg  LVOT Vmax:     156.00 cm/s  LVOT Vmean:    113.000 cm/s  LVOT VTI:     0.320 m  LVOT/AV VTI ratio: 0.41    AORTA  Ao Root diam: 3.30 cm   MITRAL VALVE        TRICUSPID VALVE  MV Area (PHT): 2.96 cm   TR Peak grad:  22.1 mmHg  MV Decel Time: 256 msec   TR Vmax:    235.00 cm/s  MV E velocity: 87.50 cm/s  MV A velocity: 122.00 cm/s SHUNTS  MV E/A ratio: 0.72     Systemic VTI: 0.32 m  Systemic Diam: 2.00 cm   Coronary CTA: Aorta: Normal size.  Mild calcifications.  No dissection.  Aortic Valve: Trileaflet. Moderate calcifications. Aortic valve calcium score 2127.  Coronary calcium score 587  Coronary Arteries:  Normal coronary origin.  Left  dominance.  RCA is a small  non-dominant artery.  There is no plaque.  Left main is a large artery that gives rise to LAD and LCX arteries. There is a mild calcified plaque in the ostium of the left main.  LAD is a large vessel. The proximal LAD with a mild (25-49%) calcified plaque. The mid LAD with multiple mild diffuse calcified plaques. The mid to distal LAD with no plaques.  LCX is a large dominant artery that gives rise to one large OM1 branch that gives rise to PDA. There is a lesion in the mid LCX which starts off as a mild calcified lesion and terminates to a moderate calcified lesion. The distal LCX with minimal calcified plaques.  Other findings:  Normal pulmonary vein drainage into the left atrium.  Normal left atrial appendage without a thrombus.  Normal size of the pulmonary artery.  IMPRESSION: 1. Coronary calcium score of 587. This was 67 percentile for age and sex matched control.  2. Normal coronary origin with left dominance.  3. Moderate CAD.  CADRADS 3.  This study will be sent for FFRct.  4. Aortic valve calcium score 2127, suggesting Severe Aortic valve stenosis.  5. Aortic atherosclerosis.  CT-FFR: 1. Left Main: 0.99  2. LAD: Proximal 0.97, Mid 0.97, Distal 0.91  3. LCX: Proximal 0.99, Mid 0.95, Distal 0.86  4. RCA: Proximal 0.97, Mid 0.94, Distal 0.89  IMPRESSION: There is no flow limiting lesions as described above on FFRct.  Recent Labs: 05/29/2020: BNP 150.7; Hemoglobin 13.6; Platelets 93 10/08/2020: BUN 13; Creatinine, Ser 0.80; Magnesium 2.2; Potassium 4.3; Sodium 143  Recent Lipid Panel No results found for: CHOL, TRIG, HDL, CHOLHDL, VLDL, LDLCALC, LDLDIRECT   Risk Assessment/Calculations:       Physical Exam:    VS:  BP 120/80   Pulse 68   Ht 5\' 9"  (1.753 m)   Wt 118 lb 12.8 oz (53.9 kg)   SpO2 92%   BMI 17.54 kg/m     Wt Readings from Last 3 Encounters:  11/06/20 118 lb 12.8 oz (53.9 kg)  10/07/20 123 lb (55.8 kg)  07/09/20  123 lb 12.8 oz (56.2 kg)     GEN:  Chronically ill-appearing woman in no acute distress HEENT: Normal NECK: No JVD; BL carotid bruits LYMPHATICS: No lymphadenopathy CARDIAC: RRR, 3/6 harsh crescendo decrescendo murmur at the RUSB, diminished A2 RESPIRATORY:  Clear to auscultation without rales, wheezing or rhonchi  ABDOMEN: Soft, non-tender, non-distended MUSCULOSKELETAL:  No edema; No deformity  SKIN: Warm and dry NEUROLOGIC:  Alert and oriented x 3 PSYCHIATRIC:  Normal affect   ASSESSMENT:    1. Nonrheumatic aortic valve stenosis    PLAN:    In order of problems listed above:  1. Symptomatic aortic stenosis: 55 yo woman with hx of heavy tobacco use presents with progressive symptoms of exertional dyspnea, orthopnea, lightheadedness, and atypical chest pain.   I have personally reviewed the patient's echo images.  She has vigorous LV systolic function.  The aortic valve is moderately calcified and restricted.  Peak transaortic velocity is 3.7 m/s with peak and mean gradients of 54 and 30 mmHg, respectively.  The dimensionless index is 0.43.  Calculated aortic valve area is 1.2 to 1.3 cm.  These findings are consistent with moderate aortic stenosis.  Her CT scan is also reviewed with a high aortic valve calcium score of 2127 possibly consistent with severe aortic stenosis.  The aortic valve is trileaflet.  There is no high-grade coronary obstructive disease based on the gated coronary CTA images and CT-FFR data. Considering her progressive symptoms and discordant exam/non-invasive findings (exam and CT suggest severe AS, echo suggests moderate AS), I favor invasive evaluation with R/L heart catheterization for definitive evaluation. I have reviewed the risks, indications, and alternatives to cardiac catheterization, possible angioplasty, and stenting with the patient. Risks include but are not limited to bleeding, infection, vascular injury, stroke, myocardial infection, arrhythmia, kidney  injury, radiation-related injury in the case of prolonged fluoroscopy use, emergency cardiac surgery, and death. The patient understands the risks of serious complication is 1-2 in 7902 with diagnostic cardiac cath and 1-2% or less with angioplasty/stenting.   Shared Decision Making/Informed Consent The risks [stroke (1 in 1000), death (1 in 1000), kidney failure [usually temporary] (1 in 500), bleeding (1 in 200), allergic reaction [possibly serious] (1 in 200)], benefits (diagnostic support and management of coronary artery disease) and alternatives of a cardiac catheterization were discussed in detail with Ms. Rossetti and she is willing to proceed.  Medication Adjustments/Labs and Tests Ordered: Current medicines are reviewed at length with the patient today.  Concerns regarding medicines are outlined above.  Orders Placed This Encounter  Procedures  . Basic metabolic panel  . CBC with Differential/Platelet   No orders of the defined types were placed in this encounter.   Patient Instructions   CATHETERIZATION INSTRUCTIONS: You are scheduled for a Cardiac Catheterization on Friday, June 10 with Dr. Sherren Mocha.  1. Please arrive at the [facility] (Main Entrance A) at [facility]: [address] [place], [place] [address] at 6:30 AM (This time is two hours before your procedure to ensure your preparation). Free valet parking service is available. You are allowed ONE visitor in the waiting room during your procedure. Both you and your guest must wear masks. Special note: Every effort is made to have your procedure done on time. Please understand that emergencies sometimes delay scheduled procedures.  2. Diet: Do not eat solid foods after midnight.  You may have clear liquids until 5am upon the day of the procedure.  3. Labs: Within 1 week.  4. Medication instructions in preparation for your procedure:  1) HOLD LASIX the morning of your cath  2) HOLD KDUR the morning of  your cath  3) Make sure to take your ASPIRIN the morning of your cath  4) You may take your other meds as directed with sips of water   5. Plan for one night stay--bring personal belongings. 6. Bring a current list of your medications and current insurance cards. 7. You MUST have a responsible person to drive you home. 8. Someone MUST be with you the first 24 hours after you arrive home or your discharge will be delayed. 9. Please wear clothes that are easy to get on and off and wear slip-on shoes.  Thank you for allowing [place] to care for you!   -- [facility]     Heart-Healthy Eating Plan Heart-healthy meal planning includes:  Eating less unhealthy fats.  Eating more healthy fats.  Making other changes in your diet. Talk with your doctor or a diet specialist (dietitian) to create an eating plan that is right for you.  What are tips  for following this plan? Cooking Avoid frying your food. Try to bake, boil, grill, or broil it instead. You can also reduce fat by:  Removing the skin from poultry.  Removing all visible fats from meats.  Steaming vegetables in water or broth. Meal planning  At meals, divide your plate into four equal parts: ? Fill one-half of your plate with vegetables and green salads. ? Fill one-fourth of your plate with whole grains. ? Fill one-fourth of your plate with lean protein foods.  Eat 4-5 servings of vegetables per day. A serving of vegetables is: ? 1 cup of raw or cooked vegetables. ? 2 cups of raw leafy greens.  Eat 4-5 servings of fruit per day. A serving of fruit is: ? 1 medium whole fruit. ?  cup of dried fruit. ?  cup of fresh, frozen, or canned fruit. ?  cup of 100% fruit juice.  Eat more foods that have soluble fiber. These are apples, broccoli, carrots, beans, peas, and barley. Try to get 20-30 g of fiber per day.  Eat 4-5 servings of nuts, legumes, and seeds per week: ? 1 serving of dried beans  or legumes equals  cup after being cooked. ? 1 serving of nuts is  cup. ? 1 serving of seeds equals 1 tablespoon.   General information  Eat more home-cooked food. Eat less restaurant, buffet, and fast food.  Limit or avoid alcohol.  Limit foods that are high in starch and sugar.  Avoid fried foods.  Lose weight if you are overweight.  Keep track of how much salt (sodium) you eat. This is important if you have high blood pressure. Ask your doctor to tell you more about this.  Try to add vegetarian meals each week. Fats  Choose healthy fats. These include olive oil and canola oil, flaxseeds, walnuts, almonds, and seeds.  Eat more omega-3 fats. These include salmon, mackerel, sardines, tuna, flaxseed oil, and ground flaxseeds. Try to eat fish at least 2 times each week.  Check food labels. Avoid foods with trans fats or high amounts of saturated fat.  Limit saturated fats. ? These are often found in animal products, such as meats, butter, and cream. ? These are also found in plant foods, such as palm oil, palm kernel oil, and coconut oil.  Avoid foods with partially hydrogenated oils in them. These have trans fats. Examples are stick margarine, some tub margarines, cookies, crackers, and other baked goods. What foods can I eat? Fruits All fresh, canned (in natural juice), or frozen fruits. Vegetables Fresh or frozen vegetables (raw, steamed, roasted, or grilled). Green salads. Grains Most grains. Choose whole wheat and whole grains most of the time. Rice and pasta, including brown rice and pastas made with whole wheat. Meats and other proteins Lean, well-trimmed beef, veal, pork, and lamb. Chicken and [place] without skin. All fish and shellfish. Wild duck, rabbit, pheasant, and venison. Egg whites or low-cholesterol egg substitutes. Dried beans, peas, lentils, and tofu. Seeds and most nuts. Dairy Low-fat or nonfat cheeses, including ricotta and mozzarella. Skim or 1% milk  that is liquid, powdered, or evaporated. Buttermilk that is made with low-fat milk. Nonfat or low-fat yogurt. Fats and oils Non-hydrogenated (trans-free) margarines. Vegetable oils, including soybean, sesame, sunflower, olive, peanut, safflower, corn, canola, and cottonseed. Salad dressings or mayonnaise made with a vegetable oil. Beverages Mineral water. Coffee and tea. Diet carbonated beverages. Sweets and desserts Sherbet, gelatin, and fruit ice. Small amounts of dark chocolate. Limit all sweets and desserts.  Seasonings and condiments All seasonings and condiments. The items listed above may not be a complete list of foods and drinks you can eat. Contact a dietitian for more options. What foods should I avoid? Fruits Canned fruit in heavy syrup. Fruit in cream or butter sauce. Fried fruit. Limit coconut. Vegetables Vegetables cooked in cheese, cream, or butter sauce. Fried vegetables. Grains Breads that are made with saturated or trans fats, oils, or whole milk. Croissants. Sweet rolls. Donuts. High-fat crackers, such as cheese crackers. Meats and other proteins Fatty meats, such as hot dogs, ribs, sausage, bacon, rib-eye roast or steak. High-fat deli meats, such as salami and bologna. Caviar. Domestic duck and goose. Organ meats, such as liver. Dairy Cream, sour cream, cream cheese, and creamed cottage cheese. Whole-milk cheeses. Whole or 2% milk that is liquid, evaporated, or condensed. Whole buttermilk. Cream sauce or high-fat cheese sauce. Yogurt that is made from whole milk. Fats and oils Meat fat, or shortening. Cocoa butter, hydrogenated oils, palm oil, coconut oil, palm kernel oil. Solid fats and shortenings, including bacon fat, salt pork, lard, and butter. Nondairy cream substitutes. Salad dressings with cheese or sour cream. Beverages Regular sodas and juice drinks with added sugar. Sweets and desserts Frosting. Pudding. Cookies. Cakes. Pies. Milk chocolate or white  chocolate. Buttered syrups. Full-fat ice cream or ice cream drinks. The items listed above may not be a complete list of foods and drinks to avoid. Contact a dietitian for more information. Summary  Heart-healthy meal planning includes eating less unhealthy fats, eating more healthy fats, and making other changes in your diet.  Eat a balanced diet. This includes fruits and vegetables, low-fat or nonfat dairy, lean protein, nuts and legumes, whole grains, and heart-healthy oils and fats. This information is not intended to replace advice given to you by your health care provider. Make sure you discuss any questions you have with your health care provider. Document Revised: 07/28/2017 Document Reviewed: 07/01/2017 Elsevier Patient Education  2021 [place], Sherren Mocha, MD  11/09/2020 9:42 PM    [place]

## 2020-11-06 NOTE — Patient Instructions (Addendum)
CATHETERIZATION INSTRUCTIONS: You are scheduled for a Cardiac Catheterization on Friday, June 10 with Dr. Sherren Mocha.  1. Please arrive at the [facility] (Main Entrance A) at [facility]: [address] [place], [place] [address] at 6:30 AM (This time is two hours before your procedure to ensure your preparation). Free valet parking service is available. You are allowed ONE visitor in the waiting room during your procedure. Both you and your guest must wear masks. Special note: Every effort is made to have your procedure done on time. Please understand that emergencies sometimes delay scheduled procedures.  2. Diet: Do not eat solid foods after midnight.  You may have clear liquids until 5am upon the day of the procedure.  3. Labs: Within 1 week.  4. Medication instructions in preparation for your procedure:  1) HOLD LASIX the morning of your cath  2) HOLD KDUR the morning of your cath  3) Make sure to take your ASPIRIN the morning of your cath  4) You may take your other meds as directed with sips of water   5. Plan for one night stay--bring personal belongings. 6. Bring a current list of your medications and current insurance cards. 7. You MUST have a responsible person to drive you home. 8. Someone MUST be with you the first 24 hours after you arrive home or your discharge will be delayed. 9. Please wear clothes that are easy to get on and off and wear slip-on shoes.  Thank you for allowing [place] to care for you!   -- [place] [facility]     Heart-Healthy Eating Plan Heart-healthy meal planning includes:  Eating less unhealthy fats.  Eating more healthy fats.  Making other changes in your diet. Talk with your doctor or a diet specialist (dietitian) to create an eating plan that is right for you.  What are tips for following this plan? Cooking Avoid frying your food. Try to bake, boil, grill, or broil it instead. You can also reduce fat  by:  Removing the skin from poultry.  Removing all visible fats from meats.  Steaming vegetables in water or broth. Meal planning  At meals, divide your plate into four equal parts: ? Fill one-half of your plate with vegetables and green salads. ? Fill one-fourth of your plate with whole grains. ? Fill one-fourth of your plate with lean protein foods.  Eat 4-5 servings of vegetables per day. A serving of vegetables is: ? 1 cup of raw or cooked vegetables. ? 2 cups of raw leafy greens.  Eat 4-5 servings of fruit per day. A serving of fruit is: ? 1 medium whole fruit. ?  cup of dried fruit. ?  cup of fresh, frozen, or canned fruit. ?  cup of 100% fruit juice.  Eat more foods that have soluble fiber. These are apples, broccoli, carrots, beans, peas, and barley. Try to get 20-30 g of fiber per day.  Eat 4-5 servings of nuts, legumes, and seeds per week: ? 1 serving of dried beans or legumes equals  cup after being cooked. ? 1 serving of nuts is  cup. ? 1 serving of seeds equals 1 tablespoon.   General information  Eat more home-cooked food. Eat less restaurant, buffet, and fast food.  Limit or avoid alcohol.  Limit foods that are high in starch and sugar.  Avoid fried foods.  Lose weight if you are overweight.  Keep track of how much salt (sodium) you eat. This is important  if you have high blood pressure. Ask your doctor to tell you more about this.  Try to add vegetarian meals each week. Fats  Choose healthy fats. These include olive oil and canola oil, flaxseeds, walnuts, almonds, and seeds.  Eat more omega-3 fats. These include salmon, mackerel, sardines, tuna, flaxseed oil, and ground flaxseeds. Try to eat fish at least 2 times each week.  Check food labels. Avoid foods with trans fats or high amounts of saturated fat.  Limit saturated fats. ? These are often found in animal products, such as meats, butter, and cream. ? These are also found in plant foods,  such as palm oil, palm kernel oil, and coconut oil.  Avoid foods with partially hydrogenated oils in them. These have trans fats. Examples are stick margarine, some tub margarines, cookies, crackers, and other baked goods. What foods can I eat? Fruits All fresh, canned (in natural juice), or frozen fruits. Vegetables Fresh or frozen vegetables (raw, steamed, roasted, or grilled). Green salads. Grains Most grains. Choose whole wheat and whole grains most of the time. Rice and pasta, including brown rice and pastas made with whole wheat. Meats and other proteins Lean, well-trimmed beef, veal, pork, and lamb. Chicken and [place] without skin. All fish and shellfish. Wild duck, rabbit, pheasant, and venison. Egg whites or low-cholesterol egg substitutes. Dried beans, peas, lentils, and tofu. Seeds and most nuts. Dairy Low-fat or nonfat cheeses, including ricotta and mozzarella. Skim or 1% milk that is liquid, powdered, or evaporated. Buttermilk that is made with low-fat milk. Nonfat or low-fat yogurt. Fats and oils Non-hydrogenated (trans-free) margarines. Vegetable oils, including soybean, sesame, sunflower, olive, peanut, safflower, corn, canola, and cottonseed. Salad dressings or mayonnaise made with a vegetable oil. Beverages Mineral water. Coffee and tea. Diet carbonated beverages. Sweets and desserts Sherbet, gelatin, and fruit ice. Small amounts of dark chocolate. Limit all sweets and desserts. Seasonings and condiments All seasonings and condiments. The items listed above may not be a complete list of foods and drinks you can eat. Contact a dietitian for more options. What foods should I avoid? Fruits Canned fruit in heavy syrup. Fruit in cream or butter sauce. Fried fruit. Limit coconut. Vegetables Vegetables cooked in cheese, cream, or butter sauce. Fried vegetables. Grains Breads that are made with saturated or trans fats, oils, or whole milk. Croissants. Sweet rolls. Donuts.  High-fat crackers, such as cheese crackers. Meats and other proteins Fatty meats, such as hot dogs, ribs, sausage, bacon, rib-eye roast or steak. High-fat deli meats, such as salami and bologna. Caviar. Domestic duck and goose. Organ meats, such as liver. Dairy Cream, sour cream, cream cheese, and creamed cottage cheese. Whole-milk cheeses. Whole or 2% milk that is liquid, evaporated, or condensed. Whole buttermilk. Cream sauce or high-fat cheese sauce. Yogurt that is made from whole milk. Fats and oils Meat fat, or shortening. Cocoa butter, hydrogenated oils, palm oil, coconut oil, palm kernel oil. Solid fats and shortenings, including bacon fat, salt pork, lard, and butter. Nondairy cream substitutes. Salad dressings with cheese or sour cream. Beverages Regular sodas and juice drinks with added sugar. Sweets and desserts Frosting. Pudding. Cookies. Cakes. Pies. Milk chocolate or white chocolate. Buttered syrups. Full-fat ice cream or ice cream drinks. The items listed above may not be a complete list of foods and drinks to avoid. Contact a dietitian for more information. Summary  Heart-healthy meal planning includes eating less unhealthy fats, eating more healthy fats, and making other changes in your diet.  Eat a balanced  diet. This includes fruits and vegetables, low-fat or nonfat dairy, lean protein, nuts and legumes, whole grains, and heart-healthy oils and fats. This information is not intended to replace advice given to you by your health care provider. Make sure you discuss any questions you have with your health care provider. Document Revised: 07/28/2017 Document Reviewed: 07/01/2017 Elsevier Patient Education  2021 [employer].

## 2020-11-09 ENCOUNTER — Encounter: Payer: Self-pay | Admitting: Cardiovascular Disease

## 2020-11-12 ENCOUNTER — Telehealth: Payer: Self-pay | Admitting: *Deleted

## 2020-11-12 DIAGNOSIS — I350 Nonrheumatic aortic (valve) stenosis: Secondary | ICD-10-CM | POA: Diagnosis not present

## 2020-11-12 NOTE — Telephone Encounter (Signed)
Call placed to patient to ask if she has had pre-procedure BMP/CBC, mailbox full, unable to leave a message.

## 2020-11-12 NOTE — Telephone Encounter (Signed)
Pt states she will go to [facility] at [place] in [place] to get BMP/CBC this afternoon.

## 2020-11-13 LAB — CBC WITH DIFFERENTIAL/PLATELET
Basophils Absolute: 0.1 10*3/uL (ref 0.0–0.2)
Basos: 2 %
EOS (ABSOLUTE): 0 10*3/uL (ref 0.0–0.4)
Eos: 0 %
Hematocrit: 41.3 % (ref 34.0–46.6)
Hemoglobin: 13.2 g/dL (ref 11.1–15.9)
Immature Grans (Abs): 0 10*3/uL (ref 0.0–0.1)
Immature Granulocytes: 0 %
Lymphocytes Absolute: 1 10*3/uL (ref 0.7–3.1)
Lymphs: 37 %
MCH: 27.4 pg (ref 26.6–33.0)
MCHC: 32 g/dL (ref 31.5–35.7)
MCV: 86 fL (ref 79–97)
Monocytes Absolute: 0.3 10*3/uL (ref 0.1–0.9)
Monocytes: 10 %
Neutrophils Absolute: 1.4 10*3/uL (ref 1.4–7.0)
Neutrophils: 51 %
Platelets: 64 10*3/uL — CL (ref 150–450)
RBC: 4.81 x10E6/uL (ref 3.77–5.28)
RDW: 14.8 % (ref 11.7–15.4)
WBC: 2.7 10*3/uL — ABNORMAL LOW (ref 3.4–10.8)

## 2020-11-13 LAB — BASIC METABOLIC PANEL
BUN/Creatinine Ratio: 24 — ABNORMAL HIGH (ref 9–23)
BUN: 21 mg/dL (ref 6–24)
CO2: 20 mmol/L (ref 20–29)
Calcium: 8.9 mg/dL (ref 8.7–10.2)
Chloride: 114 mmol/L — ABNORMAL HIGH (ref 96–106)
Creatinine, Ser: 0.86 mg/dL (ref 0.57–1.00)
Glucose: 91 mg/dL (ref 65–99)
Potassium: 4.7 mmol/L (ref 3.5–5.2)
Sodium: 147 mmol/L — ABNORMAL HIGH (ref 134–144)
eGFR: 80 mL/min/{1.73_m2} (ref >59)

## 2020-11-13 NOTE — Telephone Encounter (Addendum)
See CBC 11/12/20-per Dr Percell Belt to proceed with [facility] 11/14/20  Patient reports platelet count and WBC have been low for several years. Patient states she has been evaluated in the past for this, but has never been given reason for these findings. Patient reports her PCP, Dr Serita Grammes, at [facility] is also aware of patient's history of low platelet and WBC counts.

## 2020-11-13 NOTE — Telephone Encounter (Signed)
Pt contacted pre-catheterization scheduled at [facility] for: Friday November 14, 2020 8:30 AM Verified arrival time and place: [place] [facility]) at: 6:30 AM   No solid food after midnight prior to cath, clear liquids until 5 AM day of procedure.  Hold: Lasix/KCl-AM of procedure  Except hold medications AM meds can be  taken pre-cath with sips of water including: ASA 81 mg   Confirmed patient has responsible adult to drive home post procedure and be with patient first 24 hours after arriving home: yes  You are allowed ONE visitor in the waiting room during the time you are at the hospital for your procedure. Both you and your visitor must wear a mask once you enter the hospital.   Patient reports does not currently have any symptoms concerning for COVID-19 and no household members with COVID-19 like illness.       Reviewed procedure/mask/visitor instructions with patient.

## 2020-11-14 ENCOUNTER — Encounter (HOSPITAL_COMMUNITY): Payer: Self-pay | Admitting: Cardiovascular Disease

## 2020-11-14 ENCOUNTER — Ambulatory Visit (HOSPITAL_COMMUNITY)
Admission: RE | Admit: 2020-11-14 | Discharge: 2020-11-14 | Disposition: A | Discharge status: Home / Self Care | Payer: Medicare Other | Attending: Cardiovascular Disease | Admitting: Cardiovascular Disease

## 2020-11-14 ENCOUNTER — Encounter (HOSPITAL_COMMUNITY)
Admission: RE | Disposition: A | Discharge status: Home / Self Care | Payer: Self-pay | Source: Home / Self Care | Attending: Cardiovascular Disease

## 2020-11-14 ENCOUNTER — Other Ambulatory Visit: Payer: Self-pay

## 2020-11-14 DIAGNOSIS — I2510 Atherosclerotic heart disease of native coronary artery without angina pectoris: Secondary | ICD-10-CM | POA: Insufficient documentation

## 2020-11-14 DIAGNOSIS — Z7982 Long term (current) use of aspirin: Secondary | ICD-10-CM | POA: Insufficient documentation

## 2020-11-14 DIAGNOSIS — Z888 Allergy status to other drugs, medicaments and biological substances status: Secondary | ICD-10-CM | POA: Diagnosis not present

## 2020-11-14 DIAGNOSIS — F17210 Nicotine dependence, cigarettes, uncomplicated: Secondary | ICD-10-CM | POA: Insufficient documentation

## 2020-11-14 DIAGNOSIS — Z880 Allergy status to penicillin: Secondary | ICD-10-CM | POA: Insufficient documentation

## 2020-11-14 DIAGNOSIS — Z79899 Other long term (current) drug therapy: Secondary | ICD-10-CM | POA: Diagnosis not present

## 2020-11-14 DIAGNOSIS — Z882 Allergy status to sulfonamides status: Secondary | ICD-10-CM | POA: Diagnosis not present

## 2020-11-14 DIAGNOSIS — R079 Chest pain, unspecified: Secondary | ICD-10-CM | POA: Insufficient documentation

## 2020-11-14 DIAGNOSIS — Z885 Allergy status to narcotic agent status: Secondary | ICD-10-CM | POA: Diagnosis not present

## 2020-11-14 DIAGNOSIS — I350 Nonrheumatic aortic (valve) stenosis: Secondary | ICD-10-CM | POA: Insufficient documentation

## 2020-11-14 DIAGNOSIS — R0609 Other forms of dyspnea: Secondary | ICD-10-CM | POA: Insufficient documentation

## 2020-11-14 DIAGNOSIS — J449 Chronic obstructive pulmonary disease, unspecified: Secondary | ICD-10-CM | POA: Insufficient documentation

## 2020-11-14 HISTORY — PX: RIGHT/LEFT HEART CATH AND CORONARY ANGIOGRAPHY: CATH118266

## 2020-11-14 LAB — POCT I-STAT 7, (LYTES, BLD GAS, ICA,H+H)
Acid-base deficit: 3 mmol/L — ABNORMAL HIGH (ref 0.0–2.0)
Bicarbonate: 23.5 mmol/L (ref 20.0–28.0)
Calcium, Ion: 1.07 mmol/L — ABNORMAL LOW (ref 1.15–1.40)
HCT: 36 % (ref 36.0–46.0)
Hemoglobin: 12.2 g/dL (ref 12.0–15.0)
O2 Saturation: 96 %
Potassium: 4.1 mmol/L (ref 3.5–5.1)
Sodium: 144 mmol/L (ref 135–145)
TCO2: 25 mmol/L (ref 22–32)
pCO2 arterial: 45.1 mmHg (ref 32.0–48.0)
pH, Arterial: 7.324 — ABNORMAL LOW (ref 7.350–7.450)
pO2, Arterial: 86 mmHg (ref 83.0–108.0)

## 2020-11-14 LAB — POCT I-STAT EG7
Acid-base deficit: 3 mmol/L — ABNORMAL HIGH (ref 0.0–2.0)
Bicarbonate: 24.1 mmol/L (ref 20.0–28.0)
Calcium, Ion: 1.05 mmol/L — ABNORMAL LOW (ref 1.15–1.40)
HCT: 36 % (ref 36.0–46.0)
Hemoglobin: 12.2 g/dL (ref 12.0–15.0)
O2 Saturation: 66 %
Potassium: 3.9 mmol/L (ref 3.5–5.1)
Sodium: 146 mmol/L — ABNORMAL HIGH (ref 135–145)
TCO2: 26 mmol/L (ref 22–32)
pCO2, Ven: 49.4 mmHg (ref 44.0–60.0)
pH, Ven: 7.297 (ref 7.250–7.430)
pO2, Ven: 39 mmHg (ref 32.0–45.0)

## 2020-11-14 LAB — GLUCOSE, CAPILLARY: Glucose-Capillary: 94 mg/dL (ref 70–99)

## 2020-11-14 SURGERY — RIGHT/LEFT HEART CATH AND CORONARY ANGIOGRAPHY
Anesthesia: LOCAL

## 2020-11-14 MED ORDER — LIDOCAINE HCL (PF) 1 % IJ SOLN
INTRAMUSCULAR | Status: DC | PRN
  Administered 2020-11-14 (×2): 2 mL via INTRADERMAL

## 2020-11-14 MED ORDER — IOHEXOL 350 MG/ML SOLN
INTRAVENOUS | Status: DC | PRN
  Administered 2020-11-14: 25 mL via INTRA_ARTERIAL

## 2020-11-14 MED ORDER — HEPARIN SODIUM (PORCINE) 1000 UNIT/ML IJ SOLN
INTRAMUSCULAR | Status: DC | PRN
  Administered 2020-11-14: 3000 [IU] via INTRAVENOUS

## 2020-11-14 MED ORDER — LABETALOL HCL 5 MG/ML IV SOLN: 10.0000 mg | INTRAVENOUS | Status: DC | PRN

## 2020-11-14 MED ORDER — SODIUM CHLORIDE 0.9% FLUSH: 3.0000 mL | INTRAVENOUS | Status: DC | PRN

## 2020-11-14 MED ORDER — MIDAZOLAM HCL 2 MG/2ML IJ SOLN
INTRAMUSCULAR | Status: AC
  Filled 2020-11-14: qty 2

## 2020-11-14 MED ORDER — FENTANYL CITRATE (PF) 100 MCG/2ML IJ SOLN
INTRAMUSCULAR | Status: DC | PRN
  Administered 2020-11-14: 25 ug via INTRAVENOUS

## 2020-11-14 MED ORDER — HEPARIN (PORCINE) IN NACL 1000-0.9 UT/500ML-% IV SOLN
INTRAVENOUS | Status: DC | PRN
Start: 2020-11-14 — End: 2020-11-14
  Administered 2020-11-14 (×2): 500 mL

## 2020-11-14 MED ORDER — MIDAZOLAM HCL 2 MG/2ML IJ SOLN
INTRAMUSCULAR | Status: DC | PRN
  Administered 2020-11-14: 1 mg via INTRAVENOUS

## 2020-11-14 MED ORDER — SODIUM CHLORIDE 0.9 % IV SOLN: INTRAVENOUS | Status: DC

## 2020-11-14 MED ORDER — HYDRALAZINE HCL 20 MG/ML IJ SOLN: 10.0000 mg | INTRAMUSCULAR | Status: DC | PRN

## 2020-11-14 MED ORDER — SODIUM CHLORIDE 0.9 % IV SOLN: 250.0000 mL | INTRAVENOUS | Status: DC | PRN

## 2020-11-14 MED ORDER — LIDOCAINE HCL (PF) 1 % IJ SOLN
INTRAMUSCULAR | Status: AC
  Filled 2020-11-14: qty 30

## 2020-11-14 MED ORDER — ONDANSETRON HCL 4 MG/2ML IJ SOLN
4.0000 mg | Freq: Four times a day (QID) | INTRAMUSCULAR | Status: DC | PRN
  Administered 2020-11-14: 4 mg via INTRAVENOUS
  Filled 2020-11-14: qty 2

## 2020-11-14 MED ORDER — SODIUM CHLORIDE 0.9% FLUSH: 3.0000 mL | Freq: Two times a day (BID) | INTRAVENOUS | Status: DC

## 2020-11-14 MED ORDER — VERAPAMIL HCL 2.5 MG/ML IV SOLN
INTRAVENOUS | Status: DC | PRN
  Administered 2020-11-14: 10 mL via INTRA_ARTERIAL

## 2020-11-14 MED ORDER — HEPARIN SODIUM (PORCINE) 1000 UNIT/ML IJ SOLN
INTRAMUSCULAR | Status: AC
  Filled 2020-11-14: qty 1

## 2020-11-14 MED ORDER — TRAMADOL HCL 50 MG PO TABS
50.0000 mg | ORAL_TABLET | Freq: Once | ORAL | Status: AC
  Administered 2020-11-14: 50 mg via ORAL
  Filled 2020-11-14 (×2): qty 1

## 2020-11-14 MED ORDER — FENTANYL CITRATE (PF) 100 MCG/2ML IJ SOLN
INTRAMUSCULAR | Status: AC
  Filled 2020-11-14: qty 2

## 2020-11-14 MED ORDER — SODIUM CHLORIDE 0.9 % WEIGHT BASED INFUSION: 1.0000 mL/kg/h | INTRAVENOUS | Status: DC

## 2020-11-14 MED ORDER — VERAPAMIL HCL 2.5 MG/ML IV SOLN
INTRAVENOUS | Status: AC
  Filled 2020-11-14: qty 2

## 2020-11-14 MED ORDER — HEPARIN (PORCINE) IN NACL 1000-0.9 UT/500ML-% IV SOLN
INTRAVENOUS | Status: AC
  Filled 2020-11-14: qty 1000

## 2020-11-14 MED ORDER — ASPIRIN 81 MG PO CHEW: 81.0000 mg | CHEWABLE_TABLET | ORAL | Status: DC

## 2020-11-14 SURGICAL SUPPLY — 11 items

## 2020-11-14 NOTE — Interval H&P Note (Signed)
History and Physical Interval Note:  11/14/2020 9:08 AM  [name] [name]  has presented today for surgery, with the diagnosis of aortic stenosis.  The various methods of treatment have been discussed with the patient and family. After consideration of risks, benefits and other options for treatment, the patient has consented to  Procedure(s): RIGHT/LEFT HEART CATH AND CORONARY ANGIOGRAPHY (N/A) as a surgical intervention.  The patient's history has been reviewed, patient examined, no change in status, stable for surgery.  I have reviewed the patient's chart and labs.  Questions were answered to the patient's satisfaction.     Sherren Mocha

## 2020-11-18 ENCOUNTER — Other Ambulatory Visit: Payer: Self-pay

## 2020-11-18 DIAGNOSIS — I359 Nonrheumatic aortic valve disorder, unspecified: Secondary | ICD-10-CM

## 2020-11-19 ENCOUNTER — Other Ambulatory Visit: Payer: Self-pay | Admitting: *Deleted

## 2020-11-19 NOTE — Patient Outreach (Signed)
[place] [facility]) Care Management  11/19/2020  [name] 05-Oct-1965 [id number]   Outgoing call placed to member, no answer, unable to leave voice message as mailbox is full.  Will follow up within the next 3-4 business days.  [name], [place], MSN [place] [phone]

## 2020-11-24 ENCOUNTER — Other Ambulatory Visit: Payer: Self-pay | Admitting: *Deleted

## 2020-11-24 NOTE — Patient Outreach (Signed)
[name] Care Management  11/24/2020  [name] [name] [birth year] [id number]   Outreach attempt #2, unsuccessful, unable to leave voice message.  Will send outreach letter and follow up within the next 3-4 business days.  [name], [place], MSN [place] [phone]

## 2020-11-28 ENCOUNTER — Other Ambulatory Visit: Payer: Self-pay | Admitting: *Deleted

## 2020-11-28 NOTE — Patient Outreach (Signed)
[place] [facility]) Care Management  11/28/2020  [name] Eliette Drumwright 1966-03-18 [id number]   Outreach attempt #3, unsuccessful, unable to leave voice message as mailbox is full.  Calls placed to PCP office, last visit was 5/6, has not seen/spoken to member since.  They still have same listed number.  Call placed to husband, unsuccessful.  Call was also placed to [facility], aide remains active, will have member call this care manager.  Incoming call received from member, state she does not answer phone when aide is not in the home due to eyesight.  Report she is doing well, was having some diarrhea the past few days due to something she ate.  Denies any urgent concerns, encouraged to contact this care manager with questions.  Agrees to follow up within the next month.   Goals Addressed             This Visit's Progress    COMPLETED: [facility] - Find Help in My Community   On track    Timeframe:  Short-Term Goal Priority:  High Start Date:         5/4                Expected End Date: 6/4                  Barriers: Support System    - follow-up on any referrals for help I am given - think ahead to make sure my need does not become an emergency    Why is this important?   Knowing how and where to find help for yourself or family in your neighborhood and community is an important skill.  You will want to take some steps to learn how.    Notes:   4/7 - Call placed to PCP office to request order to increase hours for in home aides  5/4 - Conference call placed to [employer] as member is requesting to change PCS provider from [facility] to [facility]  5/17 - Confirmed with member that [facility] has started to provide aide services.  Encouraged to call agency to inquire about coverage for weekends as currently assigned aide does not work on weekends.  Advised to call family/friends to see if they can offer support over the weekend.       THN -  Matintain My Quality of Life   On track    Timeframe:  Long-Range Goal Priority:  Medium Start Date:        3/9                     Expected End Date:   6/9                    Barriers: Health Behaviors Psychosocial - Depression     - check out options for in-home help, long-term care or hospice - discuss my treatment options with the doctor or nurse - make shared treatment decisions with doctor    Why is this important?   Having a long-term illness can be scary.  It can also be stressful for you and your caregiver.  These steps may help.    Notes:   4/7 - Discussed long term plan to continue living independently  5/4 - Encouraged to reconsider other alternatives in the community (adult day care, PACE, ALF, etc.)  5/17 - Advised member to call PCP office to discuss concerns regarding current antidepressant making her  very sleepy and "mean"   6/24 - Confirms she is doing better since aide services started.  Concerned about possible UTI, will contact provider         [name], RN, MSN [place] Manager [phone]

## 2020-12-04 DIAGNOSIS — D649 Anemia, unspecified: Secondary | ICD-10-CM | POA: Diagnosis not present

## 2020-12-04 DIAGNOSIS — N6019 Diffuse cystic mastopathy of unspecified breast: Secondary | ICD-10-CM | POA: Diagnosis not present

## 2020-12-05 ENCOUNTER — Institutional Professional Consult (permissible substitution): Payer: Medicare Other | Admitting: Emergency Medicine

## 2020-12-10 ENCOUNTER — Other Ambulatory Visit: Payer: Self-pay | Admitting: *Deleted

## 2020-12-10 NOTE — Patient Outreach (Signed)
[place] [facility]) Care Management  12/10/2020  [name] 11-22-1965 [id number]   Voice message received from [place] with [employer] stating member is in need of a new aide/PCS provider.  Call placed to [facility], discussed that member's most recent aide is no longer coming to the home due to an incident with member's dog.  Discussed concern about member's ability to continue living independently in the home, unable to adequately care for herself, will discuss further with member directly.  Call placed to member, state she has spoken to the owner of [facility] and they will be sending someone new to the home on Friday.  Advised that this care manager and [name] are concerned about her living alone, continues to adamantly refuse placement in SNF or ALF.  State she is doing well on her own.  Will follow up within the next 2 weeks as planned.  [name], [place], MSN [place] [phone]

## 2020-12-18 ENCOUNTER — Other Ambulatory Visit: Payer: Self-pay | Admitting: *Deleted

## 2020-12-18 NOTE — Patient Outreach (Signed)
[place] [facility]) Care Management  12/18/2020  [name] 06-01-1966 [id number]   Incoming call received from [place] with [facility], notified that member has not had an aide in the home in almost 2 weeks. Agency ([facility]) does not have any staff to cover member's case.  This care manager called member in effort to discuss plan, possibly changing to new agency.  No answer, unable to leave voice message as mailbox is full.  Will follow up on 7/18 as planned.  [name], [place], MSN [place] [phone]'

## 2020-12-22 ENCOUNTER — Other Ambulatory Visit: Payer: Self-pay | Admitting: *Deleted

## 2020-12-22 NOTE — Patient Outreach (Signed)
[place] [facility]) Care Management  [place]  12/22/2020   [name] [name] 12/30/1965 [id number]   Outreach attempt #2, successful.  Member confirms she still has not had consistent aide coverage.  There was one provided on Friday, but Friday's are the only days [facility] are able to provide coverage.  She is reluctant to choose new agency despite trouble with the previous 3 companies.  Agency list reviewed, she does not want to look outside of [place].  Advised of minimal choices, agrees to extend search into [place].  [facility] called, awaiting call back.  Attempted to do conference calls with other agencies, member stated she didn't have more time to stay on the phone.  Denies any urgent concerns, encouraged to contact this care manager with questions.  Agrees to follow up within the next month.   Encounter Medications:  Outpatient Encounter Medications as of 12/22/2020  Medication Sig   albuterol (VENTOLIN HFA) 108 (90 Base) MCG/ACT inhaler Inhale 1-2 puffs into the lungs daily. 20 minutes prior to walking a long distance   aspirin EC 81 MG tablet Take 1 tablet (81 mg total) by mouth daily. Swallow whole.   azelastine (ASTELIN) 0.1 % nasal spray Place into both nostrils 2 (two) times daily. Use in each nostril as directed Patient states she will start medication 07/23/2020.   diltiazem (CARDIZEM CD) 120 MG 24 hr capsule Take 1 capsule (120 mg total) by mouth daily.   doxycycline (VIBRAMYCIN) 100 MG capsule Take 100 mg by mouth 2 (two) times daily.   escitalopram (LEXAPRO) 10 MG tablet Take 10 mg by mouth daily. Patient states she will start medication 07/23/2020.   fluticasone (FLONASE) 50 MCG/ACT nasal spray Place 2 sprays into both nostrils daily.   furosemide (LASIX) 20 MG tablet Take 1 tablet (20 mg total) by mouth daily.   levocetirizine (XYZAL) 5 MG tablet Take 5 mg by mouth every evening. Patient states she will start  medication 07/23/2020.   LORazepam (ATIVAN) 1 MG tablet Take with you to scan   montelukast (SINGULAIR) 10 MG tablet Take 10 mg by mouth at bedtime. Patient states she will start medication 07/23/2020.   nitrofurantoin (MACRODANTIN) 100 MG capsule Take 100 mg by mouth 2 (two) times daily.   nystatin (MYCOSTATIN) 100000 UNIT/ML suspension Take 5 mLs by mouth 3 (three) times daily.   pantoprazole (PROTONIX) 40 MG tablet Take 40 mg by mouth daily. Patient states she will start medication 07/23/2020.   potassium chloride (KLOR-CON) 10 MEQ tablet Take 1 tablet (10 mEq total) by mouth daily.   promethazine-dextromethorphan (PROMETHAZINE-DM) 6.25-15 MG/5ML syrup Take 5 mLs by mouth at bedtime as needed.   rosuvastatin (CRESTOR) 20 MG tablet Take 1 tablet (20 mg total) by mouth daily.   valACYclovir (VALTREX) 1000 MG tablet Take 1,000 mg by mouth 2 (two) times daily.   No facility-administered encounter medications on file as of 12/22/2020.    Functional Status:  No flowsheet data found.  Fall/Depression Screening: Fall Risk  08/13/2020  Falls in the past year? 1  Number falls in past yr: 1  Injury with Fall? 0  Risk for fall due to : Impaired vision;History of fall(s)   PHQ 2/9 Scores 08/13/2020  PHQ - 2 Score 2  PHQ- 9 Score 6    Assessment:   Care Plan Care Plan : General Plan of Care (Adult)  Updates made by [name], RN since 12/22/2020 12:00 AM     Problem:  Quality of Life (General Plan of Care)      Long-Range Goal: Quality of Life Maintained as evidenced by member's ability to perform adequate independent care over the next 90 days   Start Date: 12/22/2020  Expected End Date: 03/22/2021  This Visit's Progress: Not on track  Recent Progress: On track  Priority: Medium  Note:   Assessed patient's thoughts about quality of life, goals and expectations, and dissatisfaction or desire to improve.   Assessed and monitored for signs/symptoms of psychosocial concerns, especially  depression or ideations regarding harm to others or self; provide or refer for mental health services as needed.   4/7 - Confirmed member is now receiving help from in home aide service, promoting increased independence as appropriate  5/4 - Member wanting to change providers again, call placed to [facility] to discuss change  7/18 - Remains unable to perform ADL's independently, goal date reset       Goals Addressed             This Visit's Progress    THN - Find Help in My Community       Timeframe:  Short-Term Goal Priority:  High Start Date:         7/18        Expected End Date:  8/18              Barriers: Support System    - follow-up on any referrals for help I am given - think ahead to make sure my need does not become an emergency    Why is this important?   Knowing how and where to find help for yourself or family in your neighborhood and community is an important skill.  You will want to take some steps to learn how.    Notes:   4/7 - Call placed to PCP office to request order to increase hours for in home aides  5/4 - Conference call placed to [employer] as member is requesting to change PCS provider from [facility] to [facility]  5/17 - Confirmed with member that [facility] has started to provide aide services.  Encouraged to call agency to inquire about coverage for weekends as currently assigned aide does not work on weekends.  Advised to call family/friends to see if they can offer support over the weekend.  7/18 - Goal restarted as member no longer has coverage through [facility].  They are only able to provide aide on Fridays despite member having approval for 7 days a week.  Calls were placed to [employer] (unable to service member per residential location) and [place] (will attempt to find coverage, will call this RNCM back).      THN - Matintain My Quality of Life   Not on track    Timeframe:  Long-Range Goal Priority:   Medium Start Date:        7/18               Expected End Date:  10/18 (goal reset)                    Barriers: Health Behaviors Psychosocial - Depression     - check out options for in-home help, long-term care or hospice - discuss my treatment options with the doctor or nurse - make shared treatment decisions with doctor    Why is this important?   Having a long-term illness can be scary.  It can also  be stressful for you and your caregiver.  These steps may help.    Notes:   4/7 - Discussed long term plan to continue living independently  5/4 - Encouraged to reconsider other alternatives in the community (adult day care, PACE, ALF, etc.)  5/17 - Advised member to call PCP office to discuss concerns regarding current antidepressant making her very sleepy and "mean"   6/24 - Confirms she is doing better since aide services started.  Concerned about possible UTI, will contact provider  7/18 - Continues to refuse alternative resources of care or different level of care, adamant about living alone in her home despite difficulty of finding aide coverage        Plan:  Follow-up: Patient agrees to Care Plan and Follow-up. Follow-up in 3 day(s).  [name], [place], MSN [place] [phone]

## 2020-12-25 ENCOUNTER — Other Ambulatory Visit: Payer: Self-pay | Admitting: *Deleted

## 2020-12-25 NOTE — Patient Outreach (Signed)
[place] [facility]) Care Management  12/25/2020  Ludella Pranger Laiklyn [place]kenton Nov 14, 1965 [id number]   Outgoing call placed to member to review decision regarding change of PCS agency.  No answer, unable to leave voice message as mailbox is full.  Will follow up within the next 3-4 business days.  [name], [place], MSN [place] [phone]

## 2020-12-29 DIAGNOSIS — Z681 Body mass index (BMI) 19 or less, adult: Secondary | ICD-10-CM | POA: Diagnosis not present

## 2020-12-29 DIAGNOSIS — I350 Nonrheumatic aortic (valve) stenosis: Secondary | ICD-10-CM | POA: Diagnosis not present

## 2020-12-30 ENCOUNTER — Other Ambulatory Visit: Payer: Self-pay | Admitting: *Deleted

## 2020-12-30 NOTE — Patient Outreach (Signed)
[place] [facility]) Care Management  12/30/2020  Rande Kreisher Stephene Summa 1965-07-26 [id number]   Outreach attempt #2, successful.  Member report she now has aide services covered with the same agency, [facility].  Report there are 2 aides that will alternate, provide coverage 7 days, state she is very happy with their service.  Will follow up within the next month.  [name], [place], MSN [place] [phone]

## 2021-01-04 DIAGNOSIS — N6019 Diffuse cystic mastopathy of unspecified breast: Secondary | ICD-10-CM | POA: Diagnosis not present

## 2021-01-04 DIAGNOSIS — D649 Anemia, unspecified: Secondary | ICD-10-CM | POA: Diagnosis not present

## 2021-01-06 ENCOUNTER — Institutional Professional Consult (permissible substitution): Payer: Medicare Other | Admitting: Pulmonary Disease

## 2021-01-23 ENCOUNTER — Other Ambulatory Visit: Payer: Self-pay | Admitting: *Deleted

## 2021-01-23 DIAGNOSIS — F17200 Nicotine dependence, unspecified, uncomplicated: Secondary | ICD-10-CM | POA: Diagnosis not present

## 2021-01-23 DIAGNOSIS — N3001 Acute cystitis with hematuria: Secondary | ICD-10-CM | POA: Diagnosis not present

## 2021-01-23 DIAGNOSIS — M545 Low back pain, unspecified: Secondary | ICD-10-CM | POA: Diagnosis not present

## 2021-01-23 DIAGNOSIS — G8929 Other chronic pain: Secondary | ICD-10-CM | POA: Diagnosis not present

## 2021-01-23 DIAGNOSIS — Z681 Body mass index (BMI) 19 or less, adult: Secondary | ICD-10-CM | POA: Diagnosis not present

## 2021-01-23 DIAGNOSIS — R252 Cramp and spasm: Secondary | ICD-10-CM | POA: Diagnosis not present

## 2021-01-23 NOTE — Patient Outreach (Signed)
[place] [facility]) Care Management  01/23/2021  Imunique Tacheny Estaline Friedrichs 1966-03-17 [id number]   Outgoing call to member, no answer, HIPAA compliant voice message left, will follow up within the next 3-4 business days.  [name], [place], MSN [place] [phone]

## 2021-01-29 ENCOUNTER — Other Ambulatory Visit: Payer: Self-pay | Admitting: *Deleted
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
## 2021-01-29 NOTE — Patient Outreach (Addendum)
[facility]) Care Management  01/29/2021  [name] [name] 08/15/1965 [id number]   Outreach attempt #2, successful however member state she would prefer to talk when she is alone.  Request call back after 11am.      Update:  Call placed back to member per her request, no answer, unable to leave voice message as mailbox is full.  Will send outreach letter and follow up within the next 3-4 business days.  [name], [place], MSN [place] [phone]

## 2021-02-04 DIAGNOSIS — D649 Anemia, unspecified: Secondary | ICD-10-CM | POA: Diagnosis not present

## 2021-02-04 DIAGNOSIS — N6019 Diffuse cystic mastopathy of unspecified breast: Secondary | ICD-10-CM | POA: Diagnosis not present

## 2021-02-16 DIAGNOSIS — N3001 Acute cystitis with hematuria: Secondary | ICD-10-CM | POA: Diagnosis not present

## 2021-02-16 DIAGNOSIS — I350 Nonrheumatic aortic (valve) stenosis: Secondary | ICD-10-CM | POA: Diagnosis not present

## 2021-02-16 DIAGNOSIS — Z681 Body mass index (BMI) 19 or less, adult: Secondary | ICD-10-CM | POA: Diagnosis not present

## 2021-02-26 ENCOUNTER — Other Ambulatory Visit: Payer: Self-pay | Admitting: *Deleted

## 2021-02-26 NOTE — Patient Outreach (Signed)
[facility]) Care Management  02/26/2021  Shonta Bourque Jalesha Plotz May 29, 1966 [id number]   Voice message received from [facility] with [employer] requesting call back.  Call placed, notified that member is again without aide support in the home.  Discussed options, member has adamantly refused placement with this care manager as well as with Marianna Fuss.  Member advised Marianna Fuss that she has been in contact with her estranged daughter and will reportedly have more assistance in the future.  This care manager will review alternate agencies that may be able to provide aide services, will discuss transitioning to new company with member tomorrow during scheduled outreach.  [name], [place], MSN [place] [phone]

## 2021-02-27 ENCOUNTER — Other Ambulatory Visit: Payer: Self-pay | Admitting: *Deleted
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
## 2021-03-02 ENCOUNTER — Encounter: Payer: Self-pay | Admitting: Cardiology

## 2021-03-02 ENCOUNTER — Ambulatory Visit (INDEPENDENT_AMBULATORY_CARE_PROVIDER_SITE_OTHER): Payer: Medicare Other | Admitting: Cardiology

## 2021-03-02 ENCOUNTER — Other Ambulatory Visit: Payer: Self-pay

## 2021-03-02 VITALS — BP 120/80 | HR 76 | Ht 69.0 in | Wt 126.0 lb

## 2021-03-02 DIAGNOSIS — I350 Nonrheumatic aortic (valve) stenosis: Secondary | ICD-10-CM

## 2021-03-02 DIAGNOSIS — E782 Mixed hyperlipidemia: Secondary | ICD-10-CM | POA: Diagnosis not present

## 2021-03-02 DIAGNOSIS — E119 Type 2 diabetes mellitus without complications: Secondary | ICD-10-CM | POA: Diagnosis not present

## 2021-03-02 DIAGNOSIS — Z720 Tobacco use: Secondary | ICD-10-CM | POA: Diagnosis not present

## 2021-03-02 NOTE — Progress Notes (Signed)
Cardiology Office Note:    Date:  03/03/2021   ID:  [name] Josalynn Johndrow, DOB Oct 26, 1965, MRN [id number]  PCP:  Serita Grammes, MD  Cardiologist:  Berniece Salines, DO  Electrophysiologist:  None   Referring MD: Serita Grammes, MD   Chief Complaint  Patient presents with   Follow-up    History of Present Illness:    [name] is a 55 y.o. female with a hx of  moderate aortic stenosis on her recent [place] score of the aortic valve 2127 suggesting severe aortic stenosis, coronary artery disease, smoker,hyperlipidemia, hypertension, COPD here today for a follow up visit.    I last saw the patient on May 29, 2020 at that time she presented because she was having significant palpitation and had a syncope episode.  On physical exam she had a murmur therefore ZIO monitor and echocardiogram was ordered. In the interim the patient had an echocardiogram which showed moderate aortic stenosis.  She also had resume monitor which showed paroxysmal atrial tachycardia.   She was seen in February 2022 the patient complained of chest pain she was referred for coronary CTA.  These results has been called out to the patient.  I saw the patient on Oct 07, 2020 at that time we talked about her testing results and I referred the patient to our structural team for evaluation for valve replacement.  Since I last saw the patient she was able to follow-up with the structural team.  She was evaluated in repeat echocardiogram was recommended.  She had her left and right heart catheterization.  She is here today with her husband she tells me that she is experiencing shortness of breath but has not worsened.  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  Past Medical History:  Diagnosis Date   Anemia    Anxiety    Arthritis    Asthma    Bacteremia 06/30/2013   Breast infection 03/21/2012   CHF (congestive heart  failure) ([place])    hx CHF 1 year ago in [facility]   Confusion 06/25/2013   COPD (chronic obstructive pulmonary disease) (HCC)    COPD exacerbation ([place]) 06/26/2013   Cough with congestion of paranasal sinus    Depression    Diabetes mellitus without complication (HCC)    on meds   Fibromyalgia 03/31/2012   GERD (gastroesophageal reflux disease)    Heart murmur    Hemophilus influenzae (H. influenzae) infection in conditions classified elsewhere and of unspecified site 06/30/2013   History of MRSA infection 03/31/2012   Formatting of this note might be different from the original. Rt arm 02/2012 per pt in setting of spider bite   Hyperlipidemia 06/25/2013   Hypertension 03/31/2012   Insomnia 06/25/2013   Mastitis chronic 03/06/2012   Neuropathy 06/25/2013   Pneumonia 2015   Preop examination 03/15/2016   Formatting of this note might be different from the original. Added automatically from request for surgery [id number]   PTSD (post-traumatic stress disorder) 06/25/2013   Respiratory failure with hypoxia ([place]) 06/26/2013   Shoulder pain 07/01/2013   Sinus congestion    Tobacco abuse 06/25/2013   Weakness 06/25/2013    Past Surgical History:  Procedure Laterality Date   ABDOMINAL HYSTERECTOMY  1998   tubal pregnancy right side partial hysterectomy   APPENDECTOMY     BREAST SURGERY Bilateral    chronic mastitis   CATARACT EXTRACTION W/ INTRAOCULAR LENS  IMPLANT,  BILATERAL     MULTIPLE EXTRACTIONS WITH ALVEOLOPLASTY N/A 08/09/2014   Procedure: MULTIPLE EXTRACTIONS WITH ALVEOLOPLASTY ;  Surgeon: Diona Browner, DDS;  Location: [facility];  Service: Oral Surgery;  Laterality: N/A;   RIGHT/LEFT HEART CATH AND CORONARY ANGIOGRAPHY N/A 11/14/2020   Procedure: RIGHT/LEFT HEART CATH AND CORONARY ANGIOGRAPHY;  Surgeon: Sherren Mocha, MD;  Location: [facility];  Service: Cardiovascular;  Laterality: N/A;    Current Medications: Current Meds  Medication Sig   albuterol (VENTOLIN HFA) 108 (90  Base) MCG/ACT inhaler Inhale 1-2 puffs into the lungs daily. 20 minutes prior to walking a long distance   aspirin EC 81 MG tablet Take 1 tablet (81 mg total) by mouth daily. Swallow whole.   azelastine (ASTELIN) 0.1 % nasal spray Place into both nostrils 2 (two) times daily. Use in each nostril as directed Patient states she will start medication 07/23/2020.   diltiazem (CARDIZEM CD) 120 MG 24 hr capsule Take 1 capsule (120 mg total) by mouth daily.   escitalopram (LEXAPRO) 10 MG tablet Take 10 mg by mouth daily. Patient states she will start medication 07/23/2020.   fluticasone (FLONASE) 50 MCG/ACT nasal spray Place 2 sprays into both nostrils daily.   furosemide (LASIX) 20 MG tablet Take 1 tablet (20 mg total) by mouth daily.   levocetirizine (XYZAL) 5 MG tablet Take 5 mg by mouth every evening. Patient states she will start medication 07/23/2020.   LORazepam (ATIVAN) 1 MG tablet Take 1 mg by mouth daily as needed for anxiety.   montelukast (SINGULAIR) 10 MG tablet Take 10 mg by mouth at bedtime. Patient states she will start medication 07/23/2020.   nystatin (MYCOSTATIN) 100000 UNIT/ML suspension Take 5 mLs by mouth 3 (three) times daily.   pantoprazole (PROTONIX) 40 MG tablet Take 40 mg by mouth daily. Patient states she will start medication 07/23/2020.   potassium chloride (KLOR-CON) 10 MEQ tablet Take 1 tablet (10 mEq total) by mouth daily.   rosuvastatin (CRESTOR) 20 MG tablet Take 1 tablet (20 mg total) by mouth daily.     Allergies:   Clarithromycin, Penicillins, Wound dressing adhesive, Other, Propoxyphene, Sulfamethoxazole, Chocolate, Percocet [oxycodone-acetaminophen], Ranitidine, Sulfa antibiotics, Tape, Coconut fatty acids, Codeine, and Oxycodone-acetaminophen   Social History   Socioeconomic History   Marital status: Married    Spouse name: Not on file   Number of children: Not on file   Years of education: Not on file   Highest education level: Not on file  Occupational  History   Not on file  Tobacco Use   Smoking status: Every Day    Packs/day: 0.25    Years: 32.00    Pack years: 8.00    Types: Cigarettes   Smokeless tobacco: Never  Substance and Sexual Activity   Alcohol use: No   Drug use: No   Sexual activity: Not on file  Other Topics Concern   Not on file  Social History Narrative   Not on file   Social Determinants of Health   Financial Resource Strain: Not on file  Food Insecurity: Food Insecurity Present   Worried About Running Out of Food in the Last Year: Sometimes true   Ran Out of Food in the Last Year: Sometimes true  Transportation Needs: Unmet Transportation Needs   Lack of Transportation (Medical): Yes   Lack of Transportation (Non-Medical): Yes  Physical Activity: Not on file  Stress: Not on file  Social Connections: Not on file     Family History: The patient's  family history includes Hypertension in her father and mother; Skin cancer in her maternal grandmother and mother; Stroke in her mother.  ROS:   Review of Systems  Constitution: Negative for decreased appetite, fever and weight gain.  HENT: Negative for congestion, ear discharge, hoarse voice and sore throat.   Eyes: Negative for discharge, redness, vision loss in right eye and visual halos.  Cardiovascular: Negative for chest pain, dyspnea on exertion, leg swelling, orthopnea and palpitations.  Respiratory: Negative for cough, hemoptysis, shortness of breath and snoring.   Endocrine: Negative for heat intolerance and polyphagia.  Hematologic/Lymphatic: Negative for bleeding problem. Does not bruise/bleed easily.  Skin: Negative for flushing, nail changes, rash and suspicious lesions.  Musculoskeletal: Negative for arthritis, joint pain, muscle cramps, myalgias, neck pain and stiffness.  Gastrointestinal: Negative for abdominal pain, bowel incontinence, diarrhea and excessive appetite.  Genitourinary: Negative for decreased libido, genital sores and incomplete  emptying.  Neurological: Negative for brief paralysis, focal weakness, headaches and loss of balance.  Psychiatric/Behavioral: Negative for altered mental status, depression and suicidal ideas.  Allergic/Immunologic: Negative for HIV exposure and persistent infections.    EKGs/Labs/Other Studies Reviewed:    The following studies were reviewed today:   EKG: None today  Coronary CTA July 22, 2020 Aorta: Normal size.  Mild calcifications.  No dissection.   Aortic Valve: Trileaflet. Moderate calcifications. Aortic valve calcium score 2127.   Coronary calcium score 587   Coronary Arteries:  Normal coronary origin.  Left  dominance.   RCA is a small non-dominant artery.  There is no plaque.   Left main is a large artery that gives rise to LAD and LCX arteries. There is a mild calcified plaque in the ostium of the left main.   LAD is a large vessel. The proximal LAD with a mild (25-49%) calcified plaque. The mid LAD with multiple mild diffuse calcified plaques. The mid to distal LAD with no plaques.   LCX is a large dominant artery that gives rise to one large OM1 branch that gives rise to PDA. There is a lesion in the mid LCX which starts off as a mild calcified lesion and terminates to a moderate calcified lesion. The distal LCX with minimal calcified plaques.   Other findings:   Normal pulmonary vein drainage into the left atrium.   Normal left atrial appendage without a thrombus.   Normal size of the pulmonary artery.   IMPRESSION: 1. Coronary calcium score of 587. This was 4 percentile for age and sex matched control.   2. Normal coronary origin with left dominance.   3. Moderate CAD.  CADRADS 3.  This study will be sent for FFRct.   4. Aortic valve calcium score 2127, suggesting Severe Aortic valve stenosis.   5. Aortic atherosclerosis.   Zio Monitor  The patient wore the monitor for 3 days starting May 29, 2020. Indication: Palpitations   The  minimum heart rate was 48 bpm, maximum heart rate was 200 bpm, and average heart rate was 71 bpm. Predominant underlying rhythm was Sinus Rhythm.   8 Supraventricular Tachycardia runs occurred, the run with the fastest interval lasting 5 beats with a maximum rate of 200 bpm, the longest lasting 7 beats with an average rate of 137 bpm.   Premature atrial complexes were rare less than 1%. Premature Ventricular complexes were rare less than 1%.   No ventricular tachycardia, no pauses, No AV block and no atrial fibrillation present.   11 patient triggered events most of which  is associated with sinus rhythm.   Conclusion: This study is remarkable for supraventricular tachycardia which is likely paroxysmal atrial tachycardia with variable block.     TTE Impression   1. Left ventricular ejection fraction, by estimation, is 60 to 65%. The  left ventricle has normal function. The left ventricle has no regional  wall motion abnormalities. Left ventricular diastolic parameters are  consistent with Grade I diastolic  dysfunction (impaired relaxation).   2. Right ventricular systolic function is normal. The right ventricular  size is normal. There is normal pulmonary artery systolic pressure.   3. The mitral valve is normal in structure. No evidence of mitral valve  regurgitation. No evidence of mitral stenosis.   4. The aortic valve is calcified. There is moderate calcification of the  aortic valve. There is moderate thickening of the aortic valve. Aortic  valve regurgitation is mild. Moderate aortic valve stenosis. Aortic valve  area, by VTI measures 1.29 cm.  Aortic valve mean gradient measures 30.0 mmHg. Aortic valve Vmax measures  3.66 m/s.   5. The inferior vena cava is normal in size with greater than 50%  respiratory variability, suggesting right atrial pressure of 3 mmHg.   Conclusion(s)/Recommendation(s): Moderate aortic stenosis.   FINDINGS   Left Ventricle: Left ventricular  ejection fraction, by estimation, is 60  to 65%. The left ventricle has normal function. The left ventricle has no  regional wall motion abnormalities. The left ventricular internal cavity  size was normal in size. There is   no left ventricular hypertrophy. Left ventricular diastolic parameters  are consistent with Grade I diastolic dysfunction (impaired relaxation).   Right Ventricle: The right ventricular size is normal. No increase in  right ventricular wall thickness. Right ventricular systolic function is  normal. There is normal pulmonary artery systolic pressure. The tricuspid  regurgitant velocity is 2.35 m/s, and   with an assumed right atrial pressure of 3 mmHg, the estimated right  ventricular systolic pressure is 61.6 mmHg.   Left Atrium: Left atrial size was normal in size.   Right Atrium: Right atrial size was normal in size.   Pericardium: There is no evidence of pericardial effusion.   Mitral Valve: The mitral valve is normal in structure. No evidence of  mitral valve regurgitation. No evidence of mitral valve stenosis.   Tricuspid Valve: The tricuspid valve is normal in structure. Tricuspid  valve regurgitation is not demonstrated. No evidence of tricuspid  stenosis.   Aortic Valve: The aortic valve is calcified. There is moderate  calcification of the aortic valve. There is moderate thickening of the  aortic valve. Aortic valve regurgitation is mild. Moderate aortic stenosis  is present. Aortic valve mean gradient  measures 30.0 mmHg. Aortic valve peak gradient measures 53.6 mmHg. Aortic  valve area, by VTI measures 1.29 cm.   Pulmonic Valve: The pulmonic valve was normal in structure. Pulmonic valve  regurgitation is not visualized. No evidence of pulmonic stenosis.   Aorta: The aortic root is normal in size and structure.   Venous: The inferior vena cava is normal in size with greater than 50%  respiratory variability, suggesting right atrial pressure of  3 mmHg.   IAS/Shunts: No atrial level shunt detected by color flow Doppler.         Recent Labs: 05/29/2020: BNP 150.7 10/08/2020: Magnesium 2.2 11/12/2020: BUN 21; Creatinine, Ser 0.86; Platelets 64 11/14/2020: Hemoglobin 12.2; Potassium 3.9; Sodium 146  Recent Lipid Panel No results found for: CHOL, TRIG, HDL, CHOLHDL, VLDL, LDLCALC,  LDLDIRECT  Physical Exam:    VS:  BP 120/80 (BP Location: Left Arm, Patient Position: Sitting, Cuff Size: Normal)   Pulse 76   Ht 5\' 9"  (1.753 m)   Wt 126 lb (57.2 kg)   SpO2 92%   BMI 18.61 kg/m     Wt Readings from Last 3 Encounters:  03/02/21 126 lb (57.2 kg)  11/14/20 130 lb (59 kg)  11/06/20 118 lb 12.8 oz (53.9 kg)     GEN: Well nourished, well developed in no acute distress HEENT: Normal NECK: No JVD; No carotid bruits LYMPHATICS: No lymphadenopathy CARDIAC: S1S2 noted,RRR, 3/6 mid-ejection systolic murmurs, rubs, gallops RESPIRATORY:  Clear to auscultation without rales, wheezing or rhonchi  ABDOMEN: Soft, non-tender, non-distended, +bowel sounds, no guarding. EXTREMITIES: No edema, No cyanosis, no clubbing MUSCULOSKELETAL:  No deformity  SKIN: Warm and dry NEUROLOGIC:  Alert and oriented x 3, non-focal PSYCHIATRIC:  Normal affect, good insight  ASSESSMENT:    1. Nonrheumatic aortic valve stenosis   2. Diabetes mellitus without complication ([place])   3. Mixed hyperlipidemia   4. Tobacco abuse    PLAN:     She also is following with our structural heart team.  She still does admit to some shortness of breath.  Her repeat echocardiogram is pending which has been scheduled for March 20, 2021. She does not appear to be volume overloaded in the office today.  She had few questions about plans for her aortic valve, I did discuss with the patient that we will be able to answer some of her questions especially after she get her repeat echocardiogram done in October. Blood pressure is acceptable, continue with current antihypertensive  regimen. Smoking cessation advised  The patient is in agreement with the above plan. The patient left the office in stable condition.  The patient will follow up in 6 months or sooner if needed.   Medication Adjustments/Labs and Tests Ordered: Current medicines are reviewed at length with the patient today.  Concerns regarding medicines are outlined above.  No orders of the defined types were placed in this encounter.  No orders of the defined types were placed in this encounter.   Patient Instructions  Medication Instructions:  Your physician recommends that you continue on your current medications as directed. Please refer to the Current Medication list given to you today.  *If you need a refill on your cardiac medications before your next appointment, please call your pharmacy*   Lab Work: None If you have labs (blood work) drawn today and your tests are completely normal, you will receive your results only by: [place] (if you have MyChart) OR A paper copy in the mail If you have any lab test that is abnormal or we need to change your treatment, we will call you to review the results.   Testing/Procedures: None   Follow-Up: At [facility], you and your health needs are our priority.  As part of our continuing mission to provide you with exceptional heart care, we have created designated Provider Care Teams.  These Care Teams include your primary Cardiologist (physician) and Advanced Practice Providers (APPs -  Physician Assistants and Nurse Practitioners) who all work together to provide you with the care you need, when you need it.  We recommend signing up for the patient portal called "MyChart".  Sign up information is provided on this After Visit Summary.  MyChart is used to connect with patients for Virtual Visits (Telemedicine).  Patients are able to view lab/test  results, encounter notes, upcoming appointments, etc.  Non-urgent messages can be sent to your  provider as well.   To learn more about what you can do with MyChart, go to [email or web address].    Your next appointment:   6 month(s)  The format for your next appointment:   In Person  Provider:   Berniece Salines, DO [address], [place], [place] [address]     Other Instructions     Adopting a Healthy Lifestyle.  Know what a healthy weight is for you (roughly BMI <25) and aim to maintain this   Aim for 7+ servings of fruits and vegetables daily   65-80+ fluid ounces of water or unsweet tea for healthy kidneys   Limit to max 1 drink of alcohol per day; avoid smoking/tobacco   Limit animal fats in diet for cholesterol and heart health - choose grass fed whenever available   Avoid highly processed foods, and foods high in saturated/trans fats   Aim for low stress - take time to unwind and care for your mental health   Aim for 150 min of moderate intensity exercise weekly for heart health, and weights twice weekly for bone health   Aim for 7-9 hours of sleep daily   When it comes to diets, agreement about the perfect plan isnt easy to find, even among the experts. Experts at the [employer] developed an idea known as the Healthy Eating Plate. Just imagine a plate divided into logical, healthy portions.   The emphasis is on diet quality:   Load up on vegetables and fruits - one-half of your plate: Aim for color and variety, and remember that potatoes dont count.   Go for whole grains - one-quarter of your plate: Whole wheat, barley, wheat berries, quinoa, oats, brown rice, and foods made with them. If you want pasta, go with whole wheat pasta.   Protein power - one-quarter of your plate: Fish, chicken, beans, and nuts are all healthy, versatile protein sources. Limit red meat.   The diet, however, does go beyond the plate, offering a few other suggestions.   Use healthy plant oils, such as olive, canola, soy, corn, sunflower and peanut.  Check the labels, and avoid partially hydrogenated oil, which have unhealthy trans fats.   If youre thirsty, drink water. Coffee and tea are good in moderation, but skip sugary drinks and limit milk and dairy products to one or two daily servings.   The type of carbohydrate in the diet is more important than the amount. Some sources of carbohydrates, such as vegetables, fruits, whole grains, and beans-are healthier than others.   Finally, stay active  Signed, Berniece Salines, DO  03/03/2021 7:31 AM    [place]

## 2021-03-02 NOTE — Patient Instructions (Signed)
Medication Instructions:  Your physician recommends that you continue on your current medications as directed. Please refer to the Current Medication list given to you today.  *If you need a refill on your cardiac medications before your next appointment, please call your pharmacy*   Lab Work: None If you have labs (blood work) drawn today and your tests are completely normal, you will receive your results only by: [place] (if you have MyChart) OR A paper copy in the mail If you have any lab test that is abnormal or we need to change your treatment, we will call you to review the results.   Testing/Procedures: None   Follow-Up: At [facility], you and your health needs are our priority.  As part of our continuing mission to provide you with exceptional heart care, we have created designated Provider Care Teams.  These Care Teams include your primary Cardiologist (physician) and Advanced Practice Providers (APPs -  Physician Assistants and Nurse Practitioners) who all work together to provide you with the care you need, when you need it.  We recommend signing up for the patient portal called "MyChart".  Sign up information is provided on this After Visit Summary.  MyChart is used to connect with patients for Virtual Visits (Telemedicine).  Patients are able to view lab/test results, encounter notes, upcoming appointments, etc.  Non-urgent messages can be sent to your provider as well.   To learn more about what you can do with MyChart, go to [email or web address].    Your next appointment:   6 month(s)  The format for your next appointment:   In Person  Provider:   Berniece Salines, DO [address], [place], [place] [address]     Other Instructions

## 2021-03-05 ENCOUNTER — Other Ambulatory Visit: Payer: Self-pay | Admitting: *Deleted

## 2021-03-05 NOTE — Patient Outreach (Signed)
[facility]) Care Management  03/05/2021  Gelena Klosinski Shellye Zandi 25-Aug-1965 [id number]   Outgoing call placed to [facility], listed number is for [facility] office, unable to state if the [place] office is able to staff patient.  [place] office currently closed, will follow up within the next 3-4 business days.  Calls placed to [facility] and [place], neither are able to staff member's area.  Call placed to member for update, no answer, unable to leave voice message.  Will follow up within the next 3-4 business days.  [name], [place], MSN [place] [phone]

## 2021-03-06 DIAGNOSIS — I471 Supraventricular tachycardia: Secondary | ICD-10-CM | POA: Diagnosis not present

## 2021-03-06 DIAGNOSIS — J449 Chronic obstructive pulmonary disease, unspecified: Secondary | ICD-10-CM | POA: Diagnosis not present

## 2021-03-09 ENCOUNTER — Other Ambulatory Visit: Payer: Self-pay | Admitting: *Deleted

## 2021-03-09 NOTE — Patient Outreach (Signed)
[facility]) Care Management  03/09/2021  [name] July 22, 1965 [id number]   Outgoing call placed to [place], [facility], to follow up on staffing for [name].  They are unable to staff member's location currently.  Call placed to member, advised that we have tried all agencies servicing [place] for coverage, none has staffing at this time.  Encouraged to reach to outside areas, she again declines.  State her daughter is now helping, will be in the home this afternoon.  Discussed concern as member will have another echo on 10/14 and follow up with cardiology in December to discuss needed surgery for AVR.  She again state her daughter, friends, and husband will be able to help.  She will consider other agencies over the next month, will re-address during next outreach.  Denies any urgent concerns, encouraged to contact this care manager with questions.  Agrees to follow up within the next month.  [name], [place], MSN [place] [phone]

## 2021-03-23 ENCOUNTER — Other Ambulatory Visit (HOSPITAL_COMMUNITY): Payer: Medicare Other

## 2021-04-06 DIAGNOSIS — I471 Supraventricular tachycardia: Secondary | ICD-10-CM | POA: Diagnosis not present

## 2021-04-06 DIAGNOSIS — J449 Chronic obstructive pulmonary disease, unspecified: Secondary | ICD-10-CM | POA: Diagnosis not present

## 2021-04-08 ENCOUNTER — Other Ambulatory Visit: Payer: Self-pay | Admitting: *Deleted

## 2021-04-08 NOTE — Patient Outreach (Signed)
[facility]) Care Management  04/08/2021  Shaylie Eklund Necha Harries Mar 28, 1966 [id number]   Outgoing call placed to member, no answer, unable to leave voice message as mailbox is full.  Will follow up within the next 3-5 business days.  [name], [place], MSN [place] [phone]

## 2021-04-15 ENCOUNTER — Other Ambulatory Visit: Payer: Self-pay | Admitting: *Deleted
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
## 2021-04-27 ENCOUNTER — Other Ambulatory Visit (HOSPITAL_COMMUNITY): Payer: Medicare Other

## 2021-04-28 ENCOUNTER — Other Ambulatory Visit: Payer: Self-pay | Admitting: *Deleted

## 2021-04-28 NOTE — Patient Outreach (Addendum)
[facility]) Care Management  04/28/2021  [name] [name] May 19, 1966 [id number]   Outgoing call placed to member, successful.  Denies any urgent concerns, encouraged to contact this care manager with questions.  Advised that chronic care management team from PCP office would be following her going forward.  She is already familiar with the [facility] team, advised they will also have care managers for support.  She verbalizes understanding.   Will close case at time as member will have external care management program.   [name], RN, MSN [place] [phone]

## 2021-04-29 ENCOUNTER — Ambulatory Visit: Payer: Self-pay | Admitting: *Deleted

## 2021-05-05 DIAGNOSIS — M549 Dorsalgia, unspecified: Secondary | ICD-10-CM | POA: Diagnosis not present

## 2021-05-06 DIAGNOSIS — J449 Chronic obstructive pulmonary disease, unspecified: Secondary | ICD-10-CM | POA: Diagnosis not present

## 2021-05-06 DIAGNOSIS — I471 Supraventricular tachycardia: Secondary | ICD-10-CM | POA: Diagnosis not present

## 2021-05-07 DIAGNOSIS — M549 Dorsalgia, unspecified: Secondary | ICD-10-CM | POA: Diagnosis not present

## 2021-05-09 DIAGNOSIS — M545 Low back pain, unspecified: Secondary | ICD-10-CM | POA: Diagnosis not present

## 2021-05-09 DIAGNOSIS — I10 Essential (primary) hypertension: Secondary | ICD-10-CM | POA: Diagnosis not present

## 2021-05-09 DIAGNOSIS — S39012A Strain of muscle, fascia and tendon of lower back, initial encounter: Secondary | ICD-10-CM | POA: Diagnosis not present

## 2021-05-09 DIAGNOSIS — F17210 Nicotine dependence, cigarettes, uncomplicated: Secondary | ICD-10-CM | POA: Diagnosis not present

## 2021-05-09 DIAGNOSIS — Z885 Allergy status to narcotic agent status: Secondary | ICD-10-CM | POA: Diagnosis not present

## 2021-05-09 DIAGNOSIS — J449 Chronic obstructive pulmonary disease, unspecified: Secondary | ICD-10-CM | POA: Diagnosis not present

## 2021-05-09 DIAGNOSIS — Z882 Allergy status to sulfonamides status: Secondary | ICD-10-CM | POA: Diagnosis not present

## 2021-05-09 DIAGNOSIS — Z881 Allergy status to other antibiotic agents status: Secondary | ICD-10-CM | POA: Diagnosis not present

## 2021-05-09 DIAGNOSIS — Z880 Allergy status to penicillin: Secondary | ICD-10-CM | POA: Diagnosis not present

## 2021-05-09 DIAGNOSIS — E785 Hyperlipidemia, unspecified: Secondary | ICD-10-CM | POA: Diagnosis not present

## 2021-05-09 DIAGNOSIS — Z79899 Other long term (current) drug therapy: Secondary | ICD-10-CM | POA: Diagnosis not present

## 2021-05-11 ENCOUNTER — Telehealth (HOSPITAL_COMMUNITY): Payer: Self-pay | Admitting: Cardiovascular Disease

## 2021-05-11 NOTE — Telephone Encounter (Signed)
Attempted to call patient. Voicemail is full, will try again later.

## 2021-05-11 NOTE — Telephone Encounter (Signed)
Will route this message to pts ordering MD and Structural RN Navigator, as an FYI about cancelled echo.

## 2021-05-11 NOTE — Telephone Encounter (Signed)
Patient called and cancelled echocardiogram and will call back at a later date to reschedule. Order will be removed from the active echo WQ and when patient calls back we will reinstate the order. Thank you

## 2021-05-14 ENCOUNTER — Other Ambulatory Visit (HOSPITAL_COMMUNITY): Payer: Medicare Other

## 2021-05-14 NOTE — Telephone Encounter (Signed)
Scheduled the patient 07/06/21 for echo at 0815 and visit with Dr. Burt Knack at 0900. She was grateful for call and agrees with plan.

## 2021-05-15 DIAGNOSIS — R269 Unspecified abnormalities of gait and mobility: Secondary | ICD-10-CM | POA: Diagnosis not present

## 2021-05-15 DIAGNOSIS — S39012D Strain of muscle, fascia and tendon of lower back, subsequent encounter: Secondary | ICD-10-CM | POA: Diagnosis not present

## 2021-05-15 DIAGNOSIS — H6592 Unspecified nonsuppurative otitis media, left ear: Secondary | ICD-10-CM | POA: Diagnosis not present

## 2021-05-25 DIAGNOSIS — R1110 Vomiting, unspecified: Secondary | ICD-10-CM | POA: Diagnosis not present

## 2021-05-25 DIAGNOSIS — M47816 Spondylosis without myelopathy or radiculopathy, lumbar region: Secondary | ICD-10-CM | POA: Diagnosis not present

## 2021-05-25 DIAGNOSIS — I499 Cardiac arrhythmia, unspecified: Secondary | ICD-10-CM | POA: Diagnosis not present

## 2021-05-25 DIAGNOSIS — Z743 Need for continuous supervision: Secondary | ICD-10-CM | POA: Diagnosis not present

## 2021-05-25 DIAGNOSIS — M5126 Other intervertebral disc displacement, lumbar region: Secondary | ICD-10-CM | POA: Diagnosis not present

## 2021-05-25 DIAGNOSIS — R6889 Other general symptoms and signs: Secondary | ICD-10-CM | POA: Diagnosis not present

## 2021-05-25 DIAGNOSIS — M545 Low back pain, unspecified: Secondary | ICD-10-CM | POA: Diagnosis not present

## 2021-05-25 DIAGNOSIS — S32030A Wedge compression fracture of third lumbar vertebra, initial encounter for closed fracture: Secondary | ICD-10-CM | POA: Diagnosis not present

## 2021-05-25 DIAGNOSIS — M48061 Spinal stenosis, lumbar region without neurogenic claudication: Secondary | ICD-10-CM | POA: Diagnosis not present

## 2021-06-05 ENCOUNTER — Other Ambulatory Visit (HOSPITAL_COMMUNITY): Payer: Medicare Other

## 2021-06-05 ENCOUNTER — Ambulatory Visit: Payer: Medicare Other | Admitting: Cardiovascular Disease

## 2021-06-05 DIAGNOSIS — I471 Supraventricular tachycardia: Secondary | ICD-10-CM | POA: Diagnosis not present

## 2021-06-05 DIAGNOSIS — J449 Chronic obstructive pulmonary disease, unspecified: Secondary | ICD-10-CM | POA: Diagnosis not present

## 2021-06-26 DIAGNOSIS — I2510 Atherosclerotic heart disease of native coronary artery without angina pectoris: Secondary | ICD-10-CM | POA: Diagnosis not present

## 2021-06-26 DIAGNOSIS — M549 Dorsalgia, unspecified: Secondary | ICD-10-CM | POA: Diagnosis not present

## 2021-06-26 DIAGNOSIS — M47816 Spondylosis without myelopathy or radiculopathy, lumbar region: Secondary | ICD-10-CM | POA: Diagnosis not present

## 2021-06-26 DIAGNOSIS — J449 Chronic obstructive pulmonary disease, unspecified: Secondary | ICD-10-CM | POA: Diagnosis not present

## 2021-06-26 DIAGNOSIS — R0602 Shortness of breath: Secondary | ICD-10-CM | POA: Diagnosis not present

## 2021-06-26 DIAGNOSIS — I509 Heart failure, unspecified: Secondary | ICD-10-CM | POA: Diagnosis not present

## 2021-06-26 DIAGNOSIS — E119 Type 2 diabetes mellitus without complications: Secondary | ICD-10-CM | POA: Diagnosis not present

## 2021-06-26 DIAGNOSIS — Z20822 Contact with and (suspected) exposure to covid-19: Secondary | ICD-10-CM | POA: Diagnosis not present

## 2021-06-26 DIAGNOSIS — R531 Weakness: Secondary | ICD-10-CM | POA: Diagnosis not present

## 2021-06-26 DIAGNOSIS — S3982XA Other specified injuries of lower back, initial encounter: Secondary | ICD-10-CM | POA: Diagnosis not present

## 2021-06-26 DIAGNOSIS — R011 Cardiac murmur, unspecified: Secondary | ICD-10-CM | POA: Diagnosis not present

## 2021-06-26 DIAGNOSIS — M4855XA Collapsed vertebra, not elsewhere classified, thoracolumbar region, initial encounter for fracture: Secondary | ICD-10-CM | POA: Diagnosis not present

## 2021-06-26 DIAGNOSIS — N3001 Acute cystitis with hematuria: Secondary | ICD-10-CM | POA: Diagnosis not present

## 2021-06-26 DIAGNOSIS — R059 Cough, unspecified: Secondary | ICD-10-CM | POA: Diagnosis not present

## 2021-06-26 DIAGNOSIS — I110 Hypertensive heart disease with heart failure: Secondary | ICD-10-CM | POA: Diagnosis not present

## 2021-06-27 DIAGNOSIS — R531 Weakness: Secondary | ICD-10-CM | POA: Diagnosis not present

## 2021-07-06 ENCOUNTER — Ambulatory Visit (HOSPITAL_COMMUNITY): Payer: Medicare Other | Attending: Internal Medicine

## 2021-07-06 ENCOUNTER — Ambulatory Visit: Payer: Medicare Other | Admitting: Cardiovascular Disease

## 2021-07-06 ENCOUNTER — Encounter (HOSPITAL_COMMUNITY): Payer: Self-pay | Admitting: Cardiovascular Disease

## 2021-07-06 DIAGNOSIS — R5381 Other malaise: Secondary | ICD-10-CM | POA: Diagnosis not present

## 2021-07-06 DIAGNOSIS — M545 Low back pain, unspecified: Secondary | ICD-10-CM | POA: Diagnosis not present

## 2021-07-06 DIAGNOSIS — R531 Weakness: Secondary | ICD-10-CM | POA: Diagnosis not present

## 2021-07-06 DIAGNOSIS — W19XXXA Unspecified fall, initial encounter: Secondary | ICD-10-CM | POA: Diagnosis not present

## 2021-07-06 DIAGNOSIS — M549 Dorsalgia, unspecified: Secondary | ICD-10-CM | POA: Diagnosis not present

## 2021-07-07 ENCOUNTER — Telehealth: Payer: Self-pay

## 2021-07-07 DIAGNOSIS — R269 Unspecified abnormalities of gait and mobility: Secondary | ICD-10-CM | POA: Diagnosis not present

## 2021-07-07 DIAGNOSIS — A419 Sepsis, unspecified organism: Secondary | ICD-10-CM | POA: Diagnosis not present

## 2021-07-07 DIAGNOSIS — R296 Repeated falls: Secondary | ICD-10-CM | POA: Diagnosis not present

## 2021-07-07 DIAGNOSIS — I252 Old myocardial infarction: Secondary | ICD-10-CM | POA: Diagnosis not present

## 2021-07-07 DIAGNOSIS — J449 Chronic obstructive pulmonary disease, unspecified: Secondary | ICD-10-CM | POA: Diagnosis not present

## 2021-07-07 DIAGNOSIS — I609 Nontraumatic subarachnoid hemorrhage, unspecified: Secondary | ICD-10-CM | POA: Diagnosis not present

## 2021-07-07 DIAGNOSIS — R6520 Severe sepsis without septic shock: Secondary | ICD-10-CM | POA: Diagnosis not present

## 2021-07-07 DIAGNOSIS — D735 Infarction of spleen: Secondary | ICD-10-CM | POA: Diagnosis not present

## 2021-07-07 DIAGNOSIS — Z20822 Contact with and (suspected) exposure to covid-19: Secondary | ICD-10-CM | POA: Diagnosis not present

## 2021-07-07 DIAGNOSIS — I38 Endocarditis, valve unspecified: Secondary | ICD-10-CM | POA: Diagnosis not present

## 2021-07-07 DIAGNOSIS — R531 Weakness: Secondary | ICD-10-CM | POA: Diagnosis not present

## 2021-07-07 DIAGNOSIS — I509 Heart failure, unspecified: Secondary | ICD-10-CM | POA: Diagnosis not present

## 2021-07-07 DIAGNOSIS — J9811 Atelectasis: Secondary | ICD-10-CM | POA: Diagnosis not present

## 2021-07-07 DIAGNOSIS — B955 Unspecified streptococcus as the cause of diseases classified elsewhere: Secondary | ICD-10-CM | POA: Diagnosis not present

## 2021-07-07 DIAGNOSIS — R079 Chest pain, unspecified: Secondary | ICD-10-CM | POA: Diagnosis not present

## 2021-07-07 DIAGNOSIS — Z87828 Personal history of other (healed) physical injury and trauma: Secondary | ICD-10-CM | POA: Diagnosis not present

## 2021-07-07 NOTE — Telephone Encounter (Signed)
Called to reschedule echo/visit with Dr. Burt Knack as the patient no-showed yesterday.  Attempted to leave message but mailbox was full.  Will try again later.

## 2021-07-08 DIAGNOSIS — Z87828 Personal history of other (healed) physical injury and trauma: Secondary | ICD-10-CM | POA: Diagnosis not present

## 2021-07-08 DIAGNOSIS — I499 Cardiac arrhythmia, unspecified: Secondary | ICD-10-CM | POA: Diagnosis not present

## 2021-07-08 DIAGNOSIS — R6889 Other general symptoms and signs: Secondary | ICD-10-CM | POA: Diagnosis not present

## 2021-07-08 DIAGNOSIS — A419 Sepsis, unspecified organism: Secondary | ICD-10-CM | POA: Diagnosis not present

## 2021-07-08 DIAGNOSIS — I340 Nonrheumatic mitral (valve) insufficiency: Secondary | ICD-10-CM | POA: Diagnosis not present

## 2021-07-08 DIAGNOSIS — J9811 Atelectasis: Secondary | ICD-10-CM | POA: Diagnosis not present

## 2021-07-08 DIAGNOSIS — I38 Endocarditis, valve unspecified: Secondary | ICD-10-CM | POA: Diagnosis not present

## 2021-07-08 DIAGNOSIS — Z743 Need for continuous supervision: Secondary | ICD-10-CM | POA: Diagnosis not present

## 2021-07-08 DIAGNOSIS — I609 Nontraumatic subarachnoid hemorrhage, unspecified: Secondary | ICD-10-CM | POA: Diagnosis not present

## 2021-07-08 DIAGNOSIS — B955 Unspecified streptococcus as the cause of diseases classified elsewhere: Secondary | ICD-10-CM | POA: Diagnosis not present

## 2021-07-09 DIAGNOSIS — I509 Heart failure, unspecified: Secondary | ICD-10-CM | POA: Diagnosis not present

## 2021-07-09 DIAGNOSIS — I6522 Occlusion and stenosis of left carotid artery: Secondary | ICD-10-CM | POA: Diagnosis not present

## 2021-07-09 DIAGNOSIS — B955 Unspecified streptococcus as the cause of diseases classified elsewhere: Secondary | ICD-10-CM | POA: Diagnosis not present

## 2021-07-09 DIAGNOSIS — C50911 Malignant neoplasm of unspecified site of right female breast: Secondary | ICD-10-CM | POA: Diagnosis not present

## 2021-07-09 DIAGNOSIS — I6782 Cerebral ischemia: Secondary | ICD-10-CM | POA: Diagnosis not present

## 2021-07-09 DIAGNOSIS — D696 Thrombocytopenia, unspecified: Secondary | ICD-10-CM | POA: Diagnosis not present

## 2021-07-09 DIAGNOSIS — A408 Other streptococcal sepsis: Secondary | ICD-10-CM | POA: Diagnosis not present

## 2021-07-09 DIAGNOSIS — R64 Cachexia: Secondary | ICD-10-CM | POA: Diagnosis not present

## 2021-07-09 DIAGNOSIS — M797 Fibromyalgia: Secondary | ICD-10-CM | POA: Diagnosis not present

## 2021-07-09 DIAGNOSIS — I6502 Occlusion and stenosis of left vertebral artery: Secondary | ICD-10-CM | POA: Diagnosis not present

## 2021-07-09 DIAGNOSIS — J90 Pleural effusion, not elsewhere classified: Secondary | ICD-10-CM | POA: Diagnosis not present

## 2021-07-09 DIAGNOSIS — A409 Streptococcal sepsis, unspecified: Secondary | ICD-10-CM | POA: Diagnosis not present

## 2021-07-09 DIAGNOSIS — R0989 Other specified symptoms and signs involving the circulatory and respiratory systems: Secondary | ICD-10-CM | POA: Diagnosis not present

## 2021-07-09 DIAGNOSIS — I63432 Cerebral infarction due to embolism of left posterior cerebral artery: Secondary | ICD-10-CM | POA: Diagnosis not present

## 2021-07-09 DIAGNOSIS — I2510 Atherosclerotic heart disease of native coronary artery without angina pectoris: Secondary | ICD-10-CM | POA: Diagnosis not present

## 2021-07-09 DIAGNOSIS — Z66 Do not resuscitate: Secondary | ICD-10-CM | POA: Diagnosis not present

## 2021-07-09 DIAGNOSIS — G931 Anoxic brain damage, not elsewhere classified: Secondary | ICD-10-CM | POA: Diagnosis not present

## 2021-07-09 DIAGNOSIS — I609 Nontraumatic subarachnoid hemorrhage, unspecified: Secondary | ICD-10-CM | POA: Diagnosis not present

## 2021-07-09 DIAGNOSIS — I76 Septic arterial embolism: Secondary | ICD-10-CM | POA: Diagnosis not present

## 2021-07-09 DIAGNOSIS — Z880 Allergy status to penicillin: Secondary | ICD-10-CM | POA: Diagnosis not present

## 2021-07-09 DIAGNOSIS — R0602 Shortness of breath: Secondary | ICD-10-CM | POA: Diagnosis not present

## 2021-07-09 DIAGNOSIS — R6521 Severe sepsis with septic shock: Secondary | ICD-10-CM | POA: Diagnosis not present

## 2021-07-09 DIAGNOSIS — R627 Adult failure to thrive: Secondary | ICD-10-CM | POA: Diagnosis not present

## 2021-07-09 DIAGNOSIS — Z03821 Encounter for observation for suspected ingested foreign body ruled out: Secondary | ICD-10-CM | POA: Diagnosis not present

## 2021-07-09 DIAGNOSIS — I214 Non-ST elevation (NSTEMI) myocardial infarction: Secondary | ICD-10-CM | POA: Diagnosis not present

## 2021-07-09 DIAGNOSIS — I517 Cardiomegaly: Secondary | ICD-10-CM | POA: Diagnosis not present

## 2021-07-09 DIAGNOSIS — E43 Unspecified severe protein-calorie malnutrition: Secondary | ICD-10-CM | POA: Diagnosis not present

## 2021-07-09 DIAGNOSIS — K6389 Other specified diseases of intestine: Secondary | ICD-10-CM | POA: Diagnosis not present

## 2021-07-09 DIAGNOSIS — N3000 Acute cystitis without hematuria: Secondary | ICD-10-CM | POA: Diagnosis not present

## 2021-07-09 DIAGNOSIS — J449 Chronic obstructive pulmonary disease, unspecified: Secondary | ICD-10-CM | POA: Diagnosis not present

## 2021-07-09 DIAGNOSIS — I63423 Cerebral infarction due to embolism of bilateral anterior cerebral arteries: Secondary | ICD-10-CM | POA: Diagnosis not present

## 2021-07-09 DIAGNOSIS — J811 Chronic pulmonary edema: Secondary | ICD-10-CM | POA: Diagnosis not present

## 2021-07-09 DIAGNOSIS — G9341 Metabolic encephalopathy: Secondary | ICD-10-CM | POA: Diagnosis not present

## 2021-07-09 DIAGNOSIS — J439 Emphysema, unspecified: Secondary | ICD-10-CM | POA: Diagnosis not present

## 2021-07-09 DIAGNOSIS — R570 Cardiogenic shock: Secondary | ICD-10-CM | POA: Diagnosis not present

## 2021-07-09 DIAGNOSIS — E119 Type 2 diabetes mellitus without complications: Secondary | ICD-10-CM | POA: Diagnosis not present

## 2021-07-09 DIAGNOSIS — Z681 Body mass index (BMI) 19 or less, adult: Secondary | ICD-10-CM | POA: Diagnosis not present

## 2021-07-09 DIAGNOSIS — R930 Abnormal findings on diagnostic imaging of skull and head, not elsewhere classified: Secondary | ICD-10-CM | POA: Diagnosis not present

## 2021-07-09 DIAGNOSIS — J9601 Acute respiratory failure with hypoxia: Secondary | ICD-10-CM | POA: Diagnosis not present

## 2021-07-09 DIAGNOSIS — I5032 Chronic diastolic (congestive) heart failure: Secondary | ICD-10-CM | POA: Diagnosis not present

## 2021-07-09 DIAGNOSIS — E872 Acidosis, unspecified: Secondary | ICD-10-CM | POA: Diagnosis not present

## 2021-07-09 DIAGNOSIS — I088 Other rheumatic multiple valve diseases: Secondary | ICD-10-CM | POA: Diagnosis not present

## 2021-07-09 DIAGNOSIS — N390 Urinary tract infection, site not specified: Secondary | ICD-10-CM | POA: Diagnosis not present

## 2021-07-09 DIAGNOSIS — I38 Endocarditis, valve unspecified: Secondary | ICD-10-CM | POA: Diagnosis not present

## 2021-07-09 DIAGNOSIS — A419 Sepsis, unspecified organism: Secondary | ICD-10-CM | POA: Diagnosis not present

## 2021-07-09 DIAGNOSIS — R931 Abnormal findings on diagnostic imaging of heart and coronary circulation: Secondary | ICD-10-CM | POA: Diagnosis not present

## 2021-07-09 DIAGNOSIS — K7200 Acute and subacute hepatic failure without coma: Secondary | ICD-10-CM | POA: Diagnosis not present

## 2021-07-09 DIAGNOSIS — R230 Cyanosis: Secondary | ICD-10-CM | POA: Diagnosis not present

## 2021-07-09 DIAGNOSIS — R7881 Bacteremia: Secondary | ICD-10-CM | POA: Diagnosis not present

## 2021-07-09 DIAGNOSIS — S066XAA Traumatic subarachnoid hemorrhage with loss of consciousness status unknown, initial encounter: Secondary | ICD-10-CM | POA: Diagnosis not present

## 2021-07-09 DIAGNOSIS — N170 Acute kidney failure with tubular necrosis: Secondary | ICD-10-CM | POA: Diagnosis not present

## 2021-07-09 DIAGNOSIS — D735 Infarction of spleen: Secondary | ICD-10-CM | POA: Diagnosis not present

## 2021-07-09 DIAGNOSIS — J984 Other disorders of lung: Secondary | ICD-10-CM | POA: Diagnosis not present

## 2021-07-09 DIAGNOSIS — I6349 Cerebral infarction due to embolism of other cerebral artery: Secondary | ICD-10-CM | POA: Diagnosis not present

## 2021-07-09 DIAGNOSIS — I639 Cerebral infarction, unspecified: Secondary | ICD-10-CM | POA: Diagnosis not present

## 2021-07-09 DIAGNOSIS — I330 Acute and subacute infective endocarditis: Secondary | ICD-10-CM | POA: Diagnosis not present

## 2021-07-09 DIAGNOSIS — R918 Other nonspecific abnormal finding of lung field: Secondary | ICD-10-CM | POA: Diagnosis not present

## 2021-07-09 DIAGNOSIS — G928 Other toxic encephalopathy: Secondary | ICD-10-CM | POA: Diagnosis not present

## 2021-07-15 NOTE — Telephone Encounter (Signed)
There have been several attempts to call the patient to reschedule echo/office visit with Dr. Burt Knack.  Her phone number continues to give an automated message that "call cannot be completed at this time."  Her husband's line goes to a Spanish-speaking voicemail that would not allow a message to be left.  Attempted to call Zenon Mayo (listed on DPR) but was told not to call her for Ms. Sheller anymore.  It appears the patient is currently admitted to an [place] facility.   Will continue to follow/try to reach.

## 2021-07-26 ENCOUNTER — Other Ambulatory Visit: Payer: Self-pay | Admitting: Cardiology

## 2021-08-05 DEATH — deceased

## 2021-09-01 ENCOUNTER — Ambulatory Visit: Payer: Medicare Other | Admitting: Cardiology
